# Patient Record
Sex: Female | Born: 1955 | Race: White | Hispanic: No | Marital: Married | Smoking: Never smoker
Health system: Southern US, Community
[De-identification: ages and names within clinical notes are randomized; demographics above are authoritative.]

## PROBLEM LIST (undated history)

## (undated) DIAGNOSIS — Z9889 Other specified postprocedural states: Secondary | ICD-10-CM

## (undated) DIAGNOSIS — I739 Peripheral vascular disease, unspecified: Secondary | ICD-10-CM

## (undated) DIAGNOSIS — M1990 Unspecified osteoarthritis, unspecified site: Secondary | ICD-10-CM

## (undated) DIAGNOSIS — F419 Anxiety disorder, unspecified: Secondary | ICD-10-CM

## (undated) DIAGNOSIS — R51 Headache: Secondary | ICD-10-CM

## (undated) DIAGNOSIS — F32A Depression, unspecified: Secondary | ICD-10-CM

## (undated) DIAGNOSIS — F329 Major depressive disorder, single episode, unspecified: Secondary | ICD-10-CM

## (undated) DIAGNOSIS — R112 Nausea with vomiting, unspecified: Secondary | ICD-10-CM

## (undated) HISTORY — PX: BREAST SURGERY: SHX581

## (undated) HISTORY — PX: OTHER SURGICAL HISTORY: SHX169

---

## 1979-09-29 HISTORY — PX: BREAST EXCISIONAL BIOPSY: SUR124

## 2003-04-09 ENCOUNTER — Encounter: Admission: RE | Admit: 2003-04-09 | Discharge: 2003-04-09 | Payer: Self-pay | Admitting: Internal Medicine

## 2003-04-09 ENCOUNTER — Encounter: Payer: Self-pay | Admitting: Internal Medicine

## 2003-10-24 ENCOUNTER — Encounter: Admission: RE | Admit: 2003-10-24 | Discharge: 2003-10-24 | Payer: Self-pay | Admitting: Family Medicine

## 2004-06-12 ENCOUNTER — Encounter: Admission: RE | Admit: 2004-06-12 | Discharge: 2004-06-12 | Payer: Self-pay | Admitting: Family Medicine

## 2005-02-17 ENCOUNTER — Encounter: Admission: RE | Admit: 2005-02-17 | Discharge: 2005-02-17 | Payer: Self-pay | Admitting: Family Medicine

## 2005-02-19 ENCOUNTER — Other Ambulatory Visit: Admission: RE | Admit: 2005-02-19 | Discharge: 2005-02-19 | Payer: Self-pay | Admitting: Family Medicine

## 2006-03-26 ENCOUNTER — Encounter: Admission: RE | Admit: 2006-03-26 | Discharge: 2006-03-26 | Payer: Self-pay | Admitting: Internal Medicine

## 2007-07-06 ENCOUNTER — Encounter: Admission: RE | Admit: 2007-07-06 | Discharge: 2007-07-06 | Payer: Self-pay | Admitting: Obstetrics and Gynecology

## 2008-07-16 ENCOUNTER — Encounter: Admission: RE | Admit: 2008-07-16 | Discharge: 2008-07-16 | Payer: Self-pay | Admitting: Obstetrics and Gynecology

## 2009-07-29 ENCOUNTER — Other Ambulatory Visit: Admission: RE | Admit: 2009-07-29 | Discharge: 2009-07-29 | Payer: Self-pay | Admitting: Family Medicine

## 2009-08-15 ENCOUNTER — Encounter: Admission: RE | Admit: 2009-08-15 | Discharge: 2009-08-15 | Payer: Self-pay | Admitting: Family Medicine

## 2009-12-09 ENCOUNTER — Encounter: Admission: RE | Admit: 2009-12-09 | Discharge: 2009-12-09 | Payer: Self-pay | Admitting: Family Medicine

## 2009-12-13 ENCOUNTER — Encounter: Admission: RE | Admit: 2009-12-13 | Discharge: 2009-12-13 | Payer: Self-pay | Admitting: Family Medicine

## 2009-12-13 HISTORY — PX: BREAST BIOPSY: SHX20

## 2011-06-23 ENCOUNTER — Other Ambulatory Visit: Payer: Self-pay | Admitting: Family Medicine

## 2011-06-23 ENCOUNTER — Other Ambulatory Visit (HOSPITAL_COMMUNITY)
Admission: RE | Admit: 2011-06-23 | Discharge: 2011-06-23 | Disposition: A | Discharge status: Home / Self Care | Payer: BC Managed Care – PPO | Source: Ambulatory Visit | Attending: Family Medicine | Admitting: Family Medicine

## 2011-06-23 DIAGNOSIS — Z124 Encounter for screening for malignant neoplasm of cervix: Secondary | ICD-10-CM | POA: Insufficient documentation

## 2011-07-08 ENCOUNTER — Other Ambulatory Visit: Payer: Self-pay | Admitting: Family Medicine

## 2011-07-08 DIAGNOSIS — Z1231 Encounter for screening mammogram for malignant neoplasm of breast: Secondary | ICD-10-CM

## 2011-08-14 ENCOUNTER — Ambulatory Visit
Admission: RE | Admit: 2011-08-14 | Discharge: 2011-08-14 | Disposition: A | Discharge status: Home / Self Care | Payer: BC Managed Care – PPO | Source: Ambulatory Visit | Attending: Family Medicine | Admitting: Family Medicine

## 2011-08-14 DIAGNOSIS — Z1231 Encounter for screening mammogram for malignant neoplasm of breast: Secondary | ICD-10-CM

## 2011-09-24 ENCOUNTER — Encounter (HOSPITAL_COMMUNITY): Payer: Self-pay | Admitting: Pharmacy Technician

## 2011-10-04 NOTE — H&P (Signed)
Amber Kane is an 56 y.o. female.    Chief Complaint: bilateral knee OA and pain   HPI: Pt is a 56 y.o. female complaining of bilateral knee pain since 2005. She had torn meniscus in 2005 that was repaired by Dr. Ranell Patrick. The surgery helped some, however the pain returned and has been continually increased since. X-rays in the clinic show end-stage arthritic changes of the both knees. Pt has tried various conservative treatments which have failed to alleviate their symptoms including steroid injections. Various options are discussed with the patient. Risks, benefits and expectations were discussed with the patient. Patient understand the risks, benefits and expectations and wishes to proceed with surgery.   PCP:  No primary provider on file.  D/C Plans:  SNF  Post-op Meds:  No Rx given   Tranexamic Acid:  To be given  PMH: Hypercholesteremia, diet controlled Seasonal Allergies  PSH: Breast biopsy Laparoscopy D&C x2 Right knee arthroscopy  Social History: She denies the use of tobacco. Admits to drinking less than a glass a day of alcohol.  Allergies:  No Known Allergies  Medications: Multivitamin one PO daily Cetirizine 10 mg one PO qod Excedrin Migraine prn (stopped a week prior to surgery) Osteo Biflex one PO daily  ROS Review of Systems  Constitutional: Negative.   HENT: Negative.   Eyes: Negative.   Respiratory: Negative.   Cardiovascular: Negative.   Gastrointestinal: Negative.   Genitourinary: Negative.   Musculoskeletal: Positive for joint pain.  Skin: Negative.   Neurological: Negative.   Endo/Heme/Allergies: Negative.   Psychiatric/Behavioral: Negative.      Physican Exam: Physical Exam  Constitutional: She is oriented to person, place, and time and well-developed, well-nourished, and in no distress.  HENT:  Head: Normocephalic and atraumatic.  Nose: Nose normal.  Mouth/Throat: Oropharynx is clear and moist.  Eyes: Pupils are equal, round, and  reactive to light.  Neck: Neck supple. No JVD present. No tracheal deviation present. No thyromegaly present.  Cardiovascular: Normal rate and regular rhythm.   Pulmonary/Chest: Effort normal and breath sounds normal. No stridor.  Abdominal: Soft. There is no tenderness. There is no guarding.  Musculoskeletal:       Right knee: She exhibits decreased range of motion (10-90), swelling and bony tenderness. She exhibits no effusion, no ecchymosis, no deformity, no laceration and no erythema. tenderness found.       Left knee: She exhibits decreased range of motion (5-90), swelling and bony tenderness. She exhibits no effusion, no ecchymosis, no deformity, no laceration and no erythema. tenderness found.  Lymphadenopathy:    She has no cervical adenopathy.  Neurological: She is alert and oriented to person, place, and time.  Skin: Skin is warm and dry. No rash noted. No erythema. No pallor.  Psychiatric: Affect normal.     Assessment/Plan Assessment: bilateral knee OA and pain   Plan: Patient will undergo a bilateral total knee arthroplasties on 10/12/2011. Risks benefits and expectation were discussed with the patient. Patient understand risks, benefits and expectation and wishes to proceed.   Amber Kane Amber Kane   PAC  10/04/2011, 11:44 PM

## 2011-10-06 ENCOUNTER — Encounter (HOSPITAL_COMMUNITY)
Admission: RE | Admit: 2011-10-06 | Discharge: 2011-10-06 | Disposition: A | Discharge status: Home / Self Care | Payer: BC Managed Care – PPO | Source: Ambulatory Visit | Attending: Orthopedic Surgery | Admitting: Orthopedic Surgery

## 2011-10-06 ENCOUNTER — Encounter (HOSPITAL_COMMUNITY): Payer: Self-pay

## 2011-10-06 HISTORY — DX: Unspecified osteoarthritis, unspecified site: M19.90

## 2011-10-06 HISTORY — DX: Other specified postprocedural states: Z98.890

## 2011-10-06 HISTORY — DX: Depression, unspecified: F32.A

## 2011-10-06 HISTORY — DX: Headache: R51

## 2011-10-06 HISTORY — DX: Peripheral vascular disease, unspecified: I73.9

## 2011-10-06 HISTORY — DX: Nausea with vomiting, unspecified: R11.2

## 2011-10-06 HISTORY — DX: Anxiety disorder, unspecified: F41.9

## 2011-10-06 HISTORY — DX: Major depressive disorder, single episode, unspecified: F32.9

## 2011-10-06 LAB — CBC
MCH: 30.5 pg (ref 26.0–34.0)
MCHC: 33.7 g/dL (ref 30.0–36.0)
Platelets: 244 10*3/uL (ref 150–400)
RDW: 13.2 % (ref 11.5–15.5)

## 2011-10-06 LAB — SURGICAL PCR SCREEN
MRSA, PCR: NEGATIVE
Staphylococcus aureus: NEGATIVE

## 2011-10-06 LAB — DIFFERENTIAL
Basophils Absolute: 0 10*3/uL (ref 0.0–0.1)
Eosinophils Absolute: 0.2 10*3/uL (ref 0.0–0.7)
Eosinophils Relative: 3 % (ref 0–5)
Lymphs Abs: 2.2 10*3/uL (ref 0.7–4.0)
Monocytes Absolute: 0.7 10*3/uL (ref 0.1–1.0)

## 2011-10-06 LAB — URINE MICROSCOPIC-ADD ON

## 2011-10-06 LAB — URINALYSIS, ROUTINE W REFLEX MICROSCOPIC
Bilirubin Urine: NEGATIVE
Leukocytes, UA: NEGATIVE
Nitrite: NEGATIVE
Specific Gravity, Urine: 1.024 (ref 1.005–1.030)
pH: 5.5 (ref 5.0–8.0)

## 2011-10-06 LAB — BASIC METABOLIC PANEL
Calcium: 9.5 mg/dL (ref 8.4–10.5)
GFR calc Af Amer: >90 mL/min (ref >90)
GFR calc non Af Amer: >90 mL/min (ref >90)
Potassium: 3.8 mEq/L (ref 3.5–5.1)
Sodium: 137 mEq/L (ref 135–145)

## 2011-10-06 LAB — APTT: aPTT: 29 seconds (ref 24–37)

## 2011-10-06 LAB — HCG, SERUM, QUALITATIVE: Preg, Serum: NEGATIVE

## 2011-10-06 LAB — PROTIME-INR: INR: 1.03 (ref 0.00–1.49)

## 2011-10-06 MED ORDER — CEFAZOLIN SODIUM 1-5 GM-% IV SOLN: 1.0000 g | INTRAVENOUS | Status: DC

## 2011-10-06 NOTE — Patient Instructions (Signed)
20 Amber Kane  10/06/2011   Your procedure is scheduled on:  10/12/11 1200-300pm  Report to [facility] at 1000 AM.  Call this number if you have problems the morning of surgery: [phone]   Remember:   Do not eat food:After Midnight.  May have clear liquids:until Midnight .  Clear liquids include soda, tea, black coffee, apple or grape juice, broth.  Take these medicines the morning of surgery with A SIP OF WATER:    Do not wear jewelry, make-up or nail polish.  Do not wear lotions, powders, or perfumes.   Do not shave 48 hours prior to surgery.  Do not bring valuables to the hospital.  Contacts, dentures or bridgework may not be worn into surgery.  Leave suitcase in the car. After surgery it may be brought to your room.  For patients admitted to the hospital, checkout time is 11:00 AM the day of discharge.      Special Instructions: CHG Shower Use Special Wash: 1/2 bottle night before surgery and 1/2 bottle morning of surgery.   Please read over the following fact sheets that you were given: MRSA Information, Incentive Spirometry Fact Sheet, coughing and deep breathing exercises, leg exercises , Blood Transfusion Fact Sheet

## 2011-10-12 ENCOUNTER — Encounter (HOSPITAL_COMMUNITY): Payer: Self-pay | Admitting: *Deleted

## 2011-10-12 ENCOUNTER — Inpatient Hospital Stay (HOSPITAL_COMMUNITY): Payer: BC Managed Care – PPO | Admitting: Anesthesiology

## 2011-10-12 ENCOUNTER — Inpatient Hospital Stay (HOSPITAL_COMMUNITY)
Admission: RE | Admit: 2011-10-12 | Discharge: 2011-10-20 | DRG: 471 | Disposition: A | Discharge status: Skilled Nursing Facility | Payer: BC Managed Care – PPO | Source: Ambulatory Visit | Attending: Orthopedic Surgery | Admitting: Orthopedic Surgery

## 2011-10-12 ENCOUNTER — Encounter (HOSPITAL_COMMUNITY): Payer: Self-pay | Admitting: Anesthesiology

## 2011-10-12 ENCOUNTER — Encounter (HOSPITAL_COMMUNITY)
Admission: RE | Disposition: A | Discharge status: Skilled Nursing Facility | Payer: Self-pay | Source: Ambulatory Visit | Attending: Orthopedic Surgery

## 2011-10-12 DIAGNOSIS — T50995A Adverse effect of other drugs, medicaments and biological substances, initial encounter: Secondary | ICD-10-CM | POA: Diagnosis not present

## 2011-10-12 DIAGNOSIS — R51 Headache: Secondary | ICD-10-CM | POA: Diagnosis not present

## 2011-10-12 DIAGNOSIS — E7800 Pure hypercholesterolemia, unspecified: Secondary | ICD-10-CM | POA: Diagnosis present

## 2011-10-12 DIAGNOSIS — Z96659 Presence of unspecified artificial knee joint: Secondary | ICD-10-CM

## 2011-10-12 DIAGNOSIS — Z01812 Encounter for preprocedural laboratory examination: Secondary | ICD-10-CM

## 2011-10-12 DIAGNOSIS — M1710 Unilateral primary osteoarthritis, unspecified knee: Principal | ICD-10-CM | POA: Diagnosis present

## 2011-10-12 DIAGNOSIS — R112 Nausea with vomiting, unspecified: Secondary | ICD-10-CM | POA: Diagnosis not present

## 2011-10-12 HISTORY — PX: TOTAL KNEE ARTHROPLASTY: SHX125

## 2011-10-12 LAB — GLUCOSE, CAPILLARY: Glucose-Capillary: 151 mg/dL — ABNORMAL HIGH (ref 70–99)

## 2011-10-12 LAB — HEMOGLOBIN AND HEMATOCRIT, BLOOD: Hemoglobin: 11.8 g/dL — ABNORMAL LOW (ref 12.0–15.0)

## 2011-10-12 LAB — TYPE AND SCREEN: ABO/RH(D): O POS

## 2011-10-12 LAB — ABO/RH: ABO/RH(D): O POS

## 2011-10-12 SURGERY — ARTHROPLASTY, KNEE, BILATERAL, TOTAL
Anesthesia: General | Site: Knee | Laterality: Bilateral | Wound class: Clean

## 2011-10-12 MED ORDER — DEXAMETHASONE SODIUM PHOSPHATE 10 MG/ML IJ SOLN: INTRAMUSCULAR | Status: DC | PRN

## 2011-10-12 MED ORDER — DEXAMETHASONE SODIUM PHOSPHATE 10 MG/ML IJ SOLN
10.0000 mg | Freq: Once | INTRAMUSCULAR | Status: AC
  Administered 2011-10-13: 10 mg via INTRAVENOUS
  Filled 2011-10-12: qty 1

## 2011-10-12 MED ORDER — PHENOL 1.4 % MT LIQD
1.0000 | OROMUCOSAL | Status: DC | PRN
  Filled 2011-10-12: qty 177

## 2011-10-12 MED ORDER — CHLORHEXIDINE GLUCONATE 4 % EX LIQD: 60.0000 mL | Freq: Once | CUTANEOUS | Status: DC

## 2011-10-12 MED ORDER — FERROUS SULFATE 325 (65 FE) MG PO TABS
325.0000 mg | ORAL_TABLET | Freq: Three times a day (TID) | ORAL | Status: DC
  Administered 2011-10-13 – 2011-10-20 (×19): 325 mg via ORAL
  Filled 2011-10-12 (×24): qty 1

## 2011-10-12 MED ORDER — LIDOCAINE HCL (CARDIAC) 20 MG/ML IV SOLN
INTRAVENOUS | Status: DC | PRN
  Administered 2011-10-12: 50 mg via INTRAVENOUS

## 2011-10-12 MED ORDER — CEFAZOLIN SODIUM-DEXTROSE 2-3 GM-% IV SOLR
2.0000 g | Freq: Once | INTRAVENOUS | Status: AC
  Administered 2011-10-12: 2 g via INTRAVENOUS

## 2011-10-12 MED ORDER — METOCLOPRAMIDE HCL 10 MG PO TABS
5.0000 mg | ORAL_TABLET | Freq: Three times a day (TID) | ORAL | Status: DC | PRN
  Administered 2011-10-14: 10 mg via ORAL
  Filled 2011-10-12: qty 1

## 2011-10-12 MED ORDER — LORATADINE 10 MG PO TABS
10.0000 mg | ORAL_TABLET | Freq: Every day | ORAL | Status: DC
  Administered 2011-10-13 – 2011-10-20 (×7): 10 mg via ORAL
  Filled 2011-10-12 (×8): qty 1

## 2011-10-12 MED ORDER — HYDROMORPHONE HCL PF 1 MG/ML IJ SOLN
0.5000 mg | INTRAMUSCULAR | Status: DC | PRN
  Administered 2011-10-12 – 2011-10-14 (×6): 1 mg via INTRAVENOUS
  Filled 2011-10-12 (×6): qty 1

## 2011-10-12 MED ORDER — ZOLPIDEM TARTRATE 5 MG PO TABS: 5.0000 mg | ORAL_TABLET | Freq: Every evening | ORAL | Status: DC | PRN

## 2011-10-12 MED ORDER — DOCUSATE SODIUM 100 MG PO CAPS
100.0000 mg | ORAL_CAPSULE | Freq: Two times a day (BID) | ORAL | Status: DC
  Administered 2011-10-13 – 2011-10-20 (×11): 100 mg via ORAL
  Filled 2011-10-12 (×17): qty 1

## 2011-10-12 MED ORDER — METHOCARBAMOL 500 MG PO TABS
500.0000 mg | ORAL_TABLET | Freq: Four times a day (QID) | ORAL | Status: DC | PRN
  Administered 2011-10-16 – 2011-10-19 (×4): 500 mg via ORAL
  Filled 2011-10-12 (×4): qty 1

## 2011-10-12 MED ORDER — HYDROCODONE-ACETAMINOPHEN 7.5-325 MG PO TABS
1.0000 | ORAL_TABLET | ORAL | Status: DC
  Administered 2011-10-13 (×4): 1 via ORAL
  Administered 2011-10-14 (×2): 2 via ORAL
  Administered 2011-10-14: 1 via ORAL
  Administered 2011-10-14 – 2011-10-15 (×5): 2 via ORAL
  Filled 2011-10-12 (×3): qty 1
  Filled 2011-10-12: qty 2
  Filled 2011-10-12: qty 1
  Filled 2011-10-12: qty 2
  Filled 2011-10-12: qty 1
  Filled 2011-10-12 (×5): qty 2

## 2011-10-12 MED ORDER — TRANEXAMIC ACID 100 MG/ML IV SOLN
1250.0000 mg | Freq: Once | INTRAVENOUS | Status: AC
  Administered 2011-10-12: 1250 mg via INTRAVENOUS
  Filled 2011-10-12: qty 12.5

## 2011-10-12 MED ORDER — ACETAMINOPHEN 10 MG/ML IV SOLN
INTRAVENOUS | Status: DC | PRN
  Administered 2011-10-12: 1000 mg via INTRAVENOUS

## 2011-10-12 MED ORDER — PROPOFOL 10 MG/ML IV BOLUS
INTRAVENOUS | Status: DC | PRN
  Administered 2011-10-12: 170 mg via INTRAVENOUS

## 2011-10-12 MED ORDER — POLYETHYLENE GLYCOL 3350 17 G PO PACK
17.0000 g | PACK | Freq: Two times a day (BID) | ORAL | Status: DC
  Administered 2011-10-13 – 2011-10-19 (×10): 17 g via ORAL
  Filled 2011-10-12 (×17): qty 1

## 2011-10-12 MED ORDER — ALUM & MAG HYDROXIDE-SIMETH 200-200-20 MG/5ML PO SUSP: 30.0000 mL | ORAL | Status: DC | PRN

## 2011-10-12 MED ORDER — SODIUM CHLORIDE 0.9 % IR SOLN
Status: DC | PRN
  Administered 2011-10-12: 1000 mL

## 2011-10-12 MED ORDER — BUPIVACAINE HCL (PF) 0.25 % IJ SOLN
INTRAMUSCULAR | Status: DC | PRN
  Administered 2011-10-12: 10 mg
  Administered 2011-10-12: 5 mg

## 2011-10-12 MED ORDER — FENTANYL CITRATE 0.05 MG/ML IJ SOLN
50.0000 ug | INTRAMUSCULAR | Status: DC | PRN
  Administered 2011-10-12: 50 ug via INTRAVENOUS

## 2011-10-12 MED ORDER — DEXAMETHASONE SODIUM PHOSPHATE 10 MG/ML IJ SOLN
10.0000 mg | Freq: Once | INTRAMUSCULAR | Status: AC
  Administered 2011-10-12: 10 mg via INTRAVENOUS

## 2011-10-12 MED ORDER — ACETAMINOPHEN 325 MG PO TABS
650.0000 mg | ORAL_TABLET | Freq: Four times a day (QID) | ORAL | Status: DC | PRN
  Administered 2011-10-20: 650 mg via ORAL
  Filled 2011-10-12: qty 2

## 2011-10-12 MED ORDER — SODIUM CHLORIDE 0.9 % IV SOLN
INTRAVENOUS | Status: DC
  Administered 2011-10-12 – 2011-10-20 (×12): via INTRAVENOUS
  Filled 2011-10-12 (×32): qty 1000

## 2011-10-12 MED ORDER — LACTATED RINGERS IV SOLN
INTRAVENOUS | Status: DC
  Administered 2011-10-12: 17:00:00 via INTRAVENOUS

## 2011-10-12 MED ORDER — ACETAMINOPHEN 650 MG RE SUPP: 650.0000 mg | Freq: Four times a day (QID) | RECTAL | Status: DC | PRN

## 2011-10-12 MED ORDER — METHOCARBAMOL 100 MG/ML IJ SOLN
500.0000 mg | Freq: Four times a day (QID) | INTRAVENOUS | Status: DC | PRN
  Administered 2011-10-12 – 2011-10-13 (×2): 500 mg via INTRAVENOUS
  Filled 2011-10-12 (×2): qty 5

## 2011-10-12 MED ORDER — DIPHENHYDRAMINE HCL 25 MG PO CAPS: 25.0000 mg | ORAL_CAPSULE | Freq: Four times a day (QID) | ORAL | Status: DC | PRN

## 2011-10-12 MED ORDER — SUCCINYLCHOLINE CHLORIDE 20 MG/ML IJ SOLN
INTRAMUSCULAR | Status: DC | PRN
  Administered 2011-10-12: 100 mg via INTRAVENOUS

## 2011-10-12 MED ORDER — LACTATED RINGERS IV SOLN
INTRAVENOUS | Status: DC
  Administered 2011-10-12: 1000 mL via INTRAVENOUS
  Administered 2011-10-12 (×2): via INTRAVENOUS

## 2011-10-12 MED ORDER — HYDROMORPHONE HCL PF 1 MG/ML IJ SOLN
INTRAMUSCULAR | Status: DC | PRN
  Administered 2011-10-12: 1 mg via INTRAVENOUS

## 2011-10-12 MED ORDER — SODIUM CHLORIDE 0.9 % IJ SOLN
15.0000 mL/h | INTRAMUSCULAR | Status: DC
  Administered 2011-10-12 (×2): 15 mL/h via EPIDURAL
  Filled 2011-10-12 (×12): qty 7.6

## 2011-10-12 MED ORDER — SCOPOLAMINE 1 MG/3DAYS TD PT72
1.0000 | MEDICATED_PATCH | Freq: Once | TRANSDERMAL | Status: DC
  Administered 2011-10-12: 1.5 mg via TRANSDERMAL
  Filled 2011-10-12: qty 1

## 2011-10-12 MED ORDER — ONDANSETRON HCL 4 MG PO TABS
4.0000 mg | ORAL_TABLET | Freq: Four times a day (QID) | ORAL | Status: DC | PRN
  Administered 2011-10-19 (×2): 4 mg via ORAL
  Filled 2011-10-12 (×4): qty 1

## 2011-10-12 MED ORDER — CEFAZOLIN SODIUM-DEXTROSE 2-3 GM-% IV SOLR
2.0000 g | Freq: Four times a day (QID) | INTRAVENOUS | Status: AC
  Administered 2011-10-12 – 2011-10-13 (×3): 2 g via INTRAVENOUS
  Filled 2011-10-12 (×3): qty 50

## 2011-10-12 MED ORDER — MENTHOL 3 MG MT LOZG
1.0000 | LOZENGE | OROMUCOSAL | Status: DC | PRN
  Filled 2011-10-12: qty 9

## 2011-10-12 MED ORDER — FENTANYL CITRATE 0.05 MG/ML IJ SOLN
INTRAMUSCULAR | Status: DC | PRN
  Administered 2011-10-12: 100 ug via INTRAVENOUS
  Administered 2011-10-12 (×5): 50 ug via INTRAVENOUS

## 2011-10-12 MED ORDER — SODIUM CHLORIDE 0.9 % IJ SOLN
15.0000 mL/h | INTRAMUSCULAR | Status: DC
  Administered 2011-10-12 – 2011-10-13 (×2): 15 mL/h via EPIDURAL
  Filled 2011-10-12 (×8): qty 7.6

## 2011-10-12 MED ORDER — BUPIVACAINE-EPINEPHRINE 0.25% -1:200000 IJ SOLN
INTRAMUSCULAR | Status: DC | PRN
  Administered 2011-10-12: 60 mL

## 2011-10-12 MED ORDER — ONDANSETRON HCL 4 MG/2ML IJ SOLN
4.0000 mg | Freq: Three times a day (TID) | INTRAMUSCULAR | Status: DC | PRN
  Administered 2011-10-12: 4 mg via INTRAVENOUS

## 2011-10-12 MED ORDER — EPHEDRINE SULFATE 50 MG/ML IJ SOLN
INTRAMUSCULAR | Status: DC | PRN
  Administered 2011-10-12: 5 mg via INTRAVENOUS
  Administered 2011-10-12: 10 mg via INTRAVENOUS

## 2011-10-12 MED ORDER — ONDANSETRON HCL 4 MG/2ML IJ SOLN
INTRAMUSCULAR | Status: DC | PRN
  Administered 2011-10-12: 4 mg via INTRAVENOUS

## 2011-10-12 MED ORDER — METOCLOPRAMIDE HCL 5 MG/ML IJ SOLN
5.0000 mg | Freq: Three times a day (TID) | INTRAMUSCULAR | Status: DC | PRN
  Administered 2011-10-12 – 2011-10-16 (×4): 10 mg via INTRAVENOUS
  Filled 2011-10-12 (×3): qty 2

## 2011-10-12 MED ORDER — ONDANSETRON HCL 4 MG/2ML IJ SOLN
4.0000 mg | Freq: Four times a day (QID) | INTRAMUSCULAR | Status: DC | PRN
  Administered 2011-10-14 – 2011-10-18 (×7): 4 mg via INTRAVENOUS
  Filled 2011-10-12 (×7): qty 2

## 2011-10-12 MED ORDER — HYDROMORPHONE HCL PF 1 MG/ML IJ SOLN: 0.2500 mg | INTRAMUSCULAR | Status: DC | PRN

## 2011-10-12 MED ORDER — BISACODYL 5 MG PO TBEC: 5.0000 mg | DELAYED_RELEASE_TABLET | Freq: Every day | ORAL | Status: DC | PRN

## 2011-10-12 MED ORDER — MIDAZOLAM HCL 2 MG/2ML IJ SOLN
1.0000 mg | INTRAMUSCULAR | Status: DC | PRN
  Administered 2011-10-12: 2 mg via INTRAVENOUS

## 2011-10-12 MED ORDER — FLEET ENEMA 7-19 GM/118ML RE ENEM: 1.0000 | ENEMA | Freq: Once | RECTAL | Status: AC | PRN

## 2011-10-12 MED ORDER — PROMETHAZINE HCL 25 MG/ML IJ SOLN
6.2500 mg | INTRAMUSCULAR | Status: AC | PRN
  Administered 2011-10-12 (×2): 6.25 mg via INTRAVENOUS

## 2011-10-12 MED ORDER — MEPERIDINE HCL 50 MG/ML IJ SOLN: 6.2500 mg | INTRAMUSCULAR | Status: DC | PRN

## 2011-10-12 MED ORDER — KETOROLAC TROMETHAMINE 30 MG/ML IJ SOLN
INTRAMUSCULAR | Status: DC | PRN
  Administered 2011-10-12: 30 mg

## 2011-10-12 SURGICAL SUPPLY — 66 items
ADH SKN CLS APL DERMABOND .7 (GAUZE/BANDAGES/DRESSINGS) ×2
BAG SPEC THK2 15X12 ZIP CLS (MISCELLANEOUS) ×1
BAG ZIPLOCK 12X15 (MISCELLANEOUS) ×2 IMPLANT
BANDAGE ELASTIC 6 VELCRO ST LF (GAUZE/BANDAGES/DRESSINGS) ×4 IMPLANT
BANDAGE ESMARK 6X9 LF (GAUZE/BANDAGES/DRESSINGS) ×1 IMPLANT
BLADE SAW RECIPROCATING 77.5 (BLADE) ×2 IMPLANT
BLADE SAW SGTL 13.0X1.19X90.0M (BLADE) ×2 IMPLANT
BNDG CMPR 9X6 STRL LF SNTH (GAUZE/BANDAGES/DRESSINGS) ×1
BNDG COHESIVE 4X5 TAN STRL (GAUZE/BANDAGES/DRESSINGS) ×2 IMPLANT
BNDG ESMARK 6X9 LF (GAUZE/BANDAGES/DRESSINGS) ×2
BOWL SMART MIX CTS (DISPOSABLE) ×4 IMPLANT
CEMENT HV SMART SET (Cement) ×8 IMPLANT
CLOTH BEACON ORANGE TIMEOUT ST (SAFETY) ×2 IMPLANT
CUFF TOURN SGL QUICK 34 (TOURNIQUET CUFF) ×4
CUFF TRNQT CYL 34X4X40X1 (TOURNIQUET CUFF) ×2 IMPLANT
DERMABOND ADVANCED (GAUZE/BANDAGES/DRESSINGS) ×2
DERMABOND ADVANCED .7 DNX12 (GAUZE/BANDAGES/DRESSINGS) IMPLANT
DRAPE EXTREMITY BILATERAL (DRAPE) ×2 IMPLANT
DRAPE LG THREE QUARTER DISP (DRAPES) ×8 IMPLANT
DRAPE POUCH INSTRU U-SHP 10X18 (DRAPES) ×2 IMPLANT
DRAPE U-SHAPE 47X51 STRL (DRAPES) ×4 IMPLANT
DRSG ADAPTIC 3X8 NADH LF (GAUZE/BANDAGES/DRESSINGS) ×4 IMPLANT
DRSG AQUACEL AG ADV 3.5X10 (GAUZE/BANDAGES/DRESSINGS) ×2 IMPLANT
DRSG PAD ABDOMINAL 8X10 ST (GAUZE/BANDAGES/DRESSINGS) ×8 IMPLANT
DRSG TEGADERM 4X4.75 (GAUZE/BANDAGES/DRESSINGS) ×2 IMPLANT
DURAPREP 26ML APPLICATOR (WOUND CARE) ×2 IMPLANT
ELECT REM PT RETURN 9FT ADLT (ELECTROSURGICAL) ×2
ELECTRODE REM PT RTRN 9FT ADLT (ELECTROSURGICAL) ×1 IMPLANT
EVACUATOR 1/8 PVC DRAIN (DRAIN) ×4 IMPLANT
GAUZE SPONGE 2X2 8PLY STRL LF (GAUZE/BANDAGES/DRESSINGS) IMPLANT
GLOVE BIOGEL PI IND STRL 7.5 (GLOVE) ×1 IMPLANT
GLOVE BIOGEL PI IND STRL 8 (GLOVE) ×1 IMPLANT
GLOVE BIOGEL PI INDICATOR 7.5 (GLOVE) ×1
GLOVE BIOGEL PI INDICATOR 8 (GLOVE) ×1
GLOVE ECLIPSE 8.0 STRL XLNG CF (GLOVE) IMPLANT
GLOVE ORTHO TXT STRL SZ7.5 (GLOVE) ×4 IMPLANT
GLOVE SURG ORTHO 8.0 STRL STRW (GLOVE) ×2 IMPLANT
GOWN STRL NON-REIN LRG LVL3 (GOWN DISPOSABLE) ×2 IMPLANT
HANDPIECE INTERPULSE COAX TIP (DISPOSABLE) ×2
IMMOBILIZER KNEE 20 (SOFTGOODS)
IMMOBILIZER KNEE 20 THIGH 36 (SOFTGOODS) ×2 IMPLANT
IMMOBILIZER KNEE 22 UNIV (SOFTGOODS) ×2 IMPLANT
KIT BASIN OR (CUSTOM PROCEDURE TRAY) ×2 IMPLANT
MANIFOLD NEPTUNE II (INSTRUMENTS) ×2 IMPLANT
NS IRRIG 1000ML POUR BTL (IV SOLUTION) ×4 IMPLANT
PACK TOTAL JOINT (CUSTOM PROCEDURE TRAY) ×2 IMPLANT
PADDING CAST COTTON 6X4 STRL (CAST SUPPLIES) ×4 IMPLANT
POSITIONER SURGICAL ARM (MISCELLANEOUS) ×2 IMPLANT
SET HNDPC FAN SPRY TIP SCT (DISPOSABLE) ×1 IMPLANT
SET PAD KNEE POSITIONER (MISCELLANEOUS) ×4 IMPLANT
SPONGE GAUZE 2X2 STER 10/PKG (GAUZE/BANDAGES/DRESSINGS) ×2
SPONGE GAUZE 4X4 12PLY (GAUZE/BANDAGES/DRESSINGS) ×8 IMPLANT
SPONGE LAP 18X18 X RAY DECT (DISPOSABLE) ×4 IMPLANT
STAPLER VISISTAT 35W (STAPLE) IMPLANT
STOCKINETTE 8 INCH (MISCELLANEOUS) ×2 IMPLANT
STRIP CLOSURE SKIN 1/2X4 (GAUZE/BANDAGES/DRESSINGS) ×8 IMPLANT
SUCTION FRAZIER 12FR DISP (SUCTIONS) ×2 IMPLANT
SUT MNCRL AB 4-0 PS2 18 (SUTURE) ×4 IMPLANT
SUT VIC AB 1 CT1 27 (SUTURE) ×12
SUT VIC AB 1 CT1 27XBRD ANTBC (SUTURE) ×6 IMPLANT
SUT VIC AB 2-0 CT1 27 (SUTURE) ×16
SUT VIC AB 2-0 CT1 TAPERPNT 27 (SUTURE) ×8 IMPLANT
TOWEL OR 17X26 10 PK STRL BLUE (TOWEL DISPOSABLE) ×4 IMPLANT
TRAY FOLEY CATH 14FRSI W/METER (CATHETERS) ×2 IMPLANT
WATER STERILE IRR 1500ML POUR (IV SOLUTION) ×2 IMPLANT
WRAP KNEE MAXI GEL POST OP (GAUZE/BANDAGES/DRESSINGS) ×4 IMPLANT

## 2011-10-12 NOTE — Anesthesia Procedure Notes (Signed)
Epidural Patient location during procedure: holding area Start time: 10/12/2011 11:30 AM End time: 10/12/2011 11:45 AM Reason for block: post-op pain management  Staffing Anesthesiologist: Ronelle Nigh L Performed by: anesthesiologist   Preanesthetic Checklist Completed: patient identified, site marked, surgical consent, pre-op evaluation, timeout performed, IV checked, risks and benefits discussed and monitors and equipment checked  Epidural Patient position: sitting Prep: Betadine Patient monitoring: heart rate, continuous pulse ox and blood pressure  Needle:  Needle type: Hustead  Needle gauge: 18 G Needle length: 9 cm Catheter type: closed end flexible Catheter size: 20 Guage Test dose: negative and 1.5% lidocaine  Assessment Sensory level: T10  Additional Notes Test dose 1.5% Lidocaine with epi 1:200,000      On first pass with epidural needle there was continued resistance to air until needle was 2/3 way in.  I pulled needle out and noticed that there was a plug of tissue in the needle.  I redirected needle more to left in case I was lateral to the epidural space.  I do not know if the needle went lateral to the epidural space or the tissue plug cause the needle to go through the epidural space.  The second attempt, which was successful, was fairly superficial, and the resistance was not all that great, so I am doubtful I would have gotten such a plug before the epidural space.  I believe I was traversing the ligaments angling lateral from the epidural space, increasing the distance the needle traveled without any loss of resistance.  Patient tolerated the insertion well without complications.

## 2011-10-12 NOTE — Anesthesia Postprocedure Evaluation (Signed)
  Anesthesia Post-op Note  Patient: Amber Kane  Procedure(s) Performed:  TOTAL KNEE BILATERAL  Patient Location: PACU  Anesthesia Type: GA combined with regional for post-op pain  Level of Consciousness: awake and alert   Airway and Oxygen Therapy: Patient Spontanous Breathing  Post-op Pain: mild  Post-op Assessment: Post-op Vital signs reviewed, Patient's Cardiovascular Status Stable, Respiratory Function Stable, Patent Airway and No signs of Nausea or vomiting  Post-op Vital Signs: stable  Complications: No apparent anesthesia complications

## 2011-10-12 NOTE — Interval H&P Note (Signed)
History and Physical Interval Note:  10/12/2011 11:26 AM  Amber Kane  has presented today for surgery, with the diagnosis of bilateral knee osteoarthritis  The various methods of treatment have been discussed with the patient and family. After consideration of risks, benefits and other options for treatment, the patient has consented to  Procedure(s): BILATERAL TOTAL KNEE Replacements as a surgical intervention .  The patients' history has been reviewed, patient examined, no change in status, stable for surgery.  I have reviewed the patients' chart and labs.  Questions were answered to the patient's satisfaction.     Shelda Pal

## 2011-10-12 NOTE — OR Nursing (Signed)
Pt arrived into PACU w/ pale skin, very warm and clammy, w/ c/o nausea, vs obtain w/ cont POX reading, oral temp recorded, CBG also ck'd. MDA informed of above initial PACU assessment, no further orders rec'd at this time.

## 2011-10-12 NOTE — Op Note (Signed)
NAME:  Amber Kane                      MEDICAL RECORD NO.:  [id number]                             FACILITY:  [facility]      PHYSICIAN:  Madlyn Frankel. Charlann Boxer, M.D.  DATE OF BIRTH:  1956-02-27      DATE OF PROCEDURE:  10/12/2011                                     OPERATIVE REPORT         PREOPERATIVE DIAGNOSIS:  Bilateral knee osteoarthritis.      POSTOPERATIVE DIAGNOSIS:  Bilateral knee osteoarthritis.      FINDINGS:  The patient was noted to have complete loss of cartilage and   bone-on-bone arthritis with associated osteophytes in the medial and patellofemoral compartments of   both knees with a significant synovitis and associated effusion.      PROCEDURE:  Bilateral total knee replacement.      COMPONENTS USED:  DePuy rotating platform posterior stabilized knee   system, a size 4N femur, 3 tibia, 12.5 mm insert, and 35 patellar   button for both knees.     SURGEON:  Madlyn Frankel. Charlann Boxer, M.D.      ASSISTANT:  Lanney Gins, PA-C.      ANESTHESIA:  Epidural.      SPECIMENS:  None.      COMPLICATION:  None.      DRAINS:  One Hemovac.  EBL: about 100-150 cc total      TOURNIQUET TIME:   Total Tourniquet Time Documented: Thigh (Left) - 35 minutes .  38 minutes on the right     The patient was stable to the recovery room.      INDICATION FOR PROCEDURE:  Amber Kane is a 56 y.o. female patient of   mine.  The patient had been seen, evaluated, and treated conservatively in the   office with medication, activity modification, and injections.  The patient had   radiographic changes of bone-on-bone arthritis with endplate sclerosis and osteophytes noted in both knees, Predominately on the medial and patellofemoral segments.     The patient failed conservative measures including medication, injections, and activity modification, and at this point was ready for more definitive measures.   Based on the radiographic changes and failed conservative measures, the patient   decided  to proceed with total knee replacement.  Risks of infection,   DVT, component failure, need for revision surgery, postop course, and   expectations were all   discussed and reviewed.  Consent was obtained for benefit of pain   relief.      PROCEDURE IN DETAIL:  The patient was brought to the operative theater.   Once adequate anesthesia, preoperative antibiotics, 2 gm of Ancef administered, the patient was positioned supine with the bilateral thigh tourniquets placed.   Bilateral lower extremities were prepped and draped in sterile fashion.  A time-   out was performed identifying the patient, planned procedure, and the  extremities.      The both lower extremities were placed in [facility] leg holders.    Attention was first directed to the left knee.  The leg was   exsanguinated, tourniquet elevated to 250 mmHg.  A midline incision was   made followed by median parapatellar arthrotomy.  Following initial   exposure, attention was first directed to the patella.  Precut   measurement was noted to be 21 mm.  I resected down to 13 mm and used a   35 patellar button to restore patellar height as well as cover the cut   surface.      The lug holes were drilled and a metal shim was placed to protect the   patella from retractors and saw blades.      At this point, attention was now directed to the femur.  The femoral   canal was opened with a drill, irrigated to try to prevent fat emboli.  An   intramedullary rod was passed at 3 degrees valgus, 11 mm of bone was   resected off the distal femur due to a flexion contracture.  Following this resection, the tibia was   subluxated anteriorly.  Using the extramedullary guide, 10 mm of bone was resected off   the proximal lateral tibia.  We confirmed the gap would be   stable medially and laterally with a 10 mm insert as well as confirmed   the cut was perpendicular in the coronal plane, checking with an alignment rod.      Once this was done, I  sized the femur to be a size 4 in the anterior-   posterior dimension, chose a narrow component based on medial and   lateral dimension.  The size 4 rotation block was then pinned in   position anterior referenced using the C-clamp to set rotation.  The   anterior, posterior, and  chamfer cuts were made without difficulty nor   notching making certain that I was along the anterior cortex to help   with flexion gap stability.      The final box cut was made off the lateral aspect of distal femur.      At this point, the tibia was sized to be a size 3, the size 3 tray was   then pinned in position through the medial third of the tubercle,   drilled, and keel punched.  Trial reduction was now carried with a 4N femur,  3 tibia, a 12.5 mm insert, and the 35 patella botton.  The knee was brought to   extension, full extension with good flexion stability with the patella   tracking through the trochlea without application of pressure.  Given   all these findings, the trial components removed.  Final components were   opened and cement was mixed.  The knee was irrigated with normal saline   solution and pulse lavage.  The synovial lining was   then injected with 0.25% Marcaine with epinephrine and 1 cc of Toradol,   total of 61 cc.      The knee was irrigated.  Final implants were then cemented onto clean and   dried cut surfaces of bone with the knee brought to extension with a 12.5   mm trial insert.      Once the cement had fully cured, the excess cement was removed   throughout the knee.  I confirmed I was satisfied with the range of   motion and stability, and the final 12.5 mm insert was chosen.  It was   placed into the knee.      The tourniquet had been let down at 34 minutes.  No significant   hemostasis required.  The medium Hemovac  drain was placed deep.  The   extensor mechanism was then reapproximated using #1 Vicryl with the knee   in flexion.  The   remaining wound was  closed with 2-0 Vicryl and running 4-0 Monocryl.   The knee was cleaned, dried, dressed sterilely using Dermabond and   Aquacel dressing.  Drain site dressed separately.    Attention was now directed to the right knee.    The leg was exsanguinated, tourniquet elevated to 250 mmHg.  A midline incision was   made followed by median parapatellar arthrotomy.  Following initial   exposure, attention was first directed to the patella.  Precut   measurement was noted to be 21 mm.  I resected down to 13 mm and used a   35 patellar button to restore patellar height as well as cover the cut   surface.      The lug holes were drilled and a metal shim was placed to protect the   patella from retractors and saw blades.      At this point, attention was now directed to the femur.  The femoral   canal was opened with a drill, irrigated to try to prevent fat emboli.  An   intramedullary rod was passed at 3 degrees valgus, 11 mm of bone was   resected off the distal femur due to a flexion contracture.  Following this resection, the tibia was   subluxated anteriorly.  Using the extramedullary guide, 10 mm of bone was resected off   the proximal lateral tibia.  We confirmed the gap would be   stable medially and laterally with a 10 mm insert as well as confirmed   the cut was perpendicular in the coronal plane, checking with an alignment rod.      Once this was done, I sized the femur to be a size 4 in the anterior-   posterior dimension, chose a narrow component based on medial and   lateral dimension.  The size 4 rotation block was then pinned in   position anterior referenced using the C-clamp to set rotation.  The   anterior, posterior, and  chamfer cuts were made without difficulty nor   notching making certain that I was along the anterior cortex to help   with flexion gap stability.      The final box cut was made off the lateral aspect of distal femur.      At this point, the tibia was sized  to be a size 3, the size 3 tray was   then pinned in position through the medial third of the tubercle,   drilled, and keel punched.  Trial reduction was now carried with a 4N femur,  3 tibia, a 12.5 mm insert, and the 35 patella botton.  The knee was brought to   extension, full extension with good flexion stability with the patella   tracking through the trochlea without application of pressure.  Given   all these findings, the trial components removed.  Final components were   opened and cement was mixed.  The knee was irrigated with normal saline   solution and pulse lavage.  The synovial lining was   then injected with 0.25% Marcaine with epinephrine and 1 cc of Toradol,   total of 61 cc.      The knee was irrigated.  Final implants were then cemented onto clean and   dried cut surfaces of bone with the knee brought to extension with a  12.5   mm trial insert.      Once the cement had fully cured, the excess cement was removed   throughout the knee.  I confirmed I was satisfied with the range of   motion and stability, and the final 12.5 mm insert was chosen.  It was   placed into the knee.      The tourniquet had been let down at 34 minutes.  No significant   hemostasis required.  The medium Hemovac drain was placed deep.  The   extensor mechanism was then reapproximated using #1 Vicryl with the knee   in flexion.  The   remaining wound was closed with 2-0 Vicryl and running 4-0 Monocryl.   The knee was cleaned, dried, dressed sterilely using Dermabond and   Aquacel dressing.  Drain site dressed separately. The patient was then brought to recovery room in stable condition, tolerating the procedure   well.   Please note that Physician Assistant, Lanney Gins, was present for the entirety of the case, and was utilized for pre-operative positioning, peri-operative retractor management, general facilitation of the procedure.  He was also utilized for primary wound closure at the end  of the case.              Madlyn Frankel Charlann Boxer, M.D.

## 2011-10-12 NOTE — Transfer of Care (Signed)
Immediate Anesthesia Transfer of Care Note  Patient: Amber Kane  Procedure(s) Performed:  TOTAL KNEE BILATERAL  Patient Location: PACU  Anesthesia Type: General and Epidural  Level of Consciousness: sedated, patient cooperative and responds to stimulaton  Airway & Oxygen Therapy: Patient Spontanous Breathing and Patient connected to face mask oxgen  Post-op Assessment: Report given to PACU RN and Post -op Vital signs reviewed and stable, MAE X4  Post vital signs: Reviewed and stable  Complications: No apparent anesthesia complications

## 2011-10-12 NOTE — Anesthesia Preprocedure Evaluation (Addendum)
Anesthesia Evaluation  Patient identified by MRN, date of birth, ID band Patient awake    Reviewed: Allergy & Precautions, H&P , NPO status , Patient's Chart, lab work & pertinent test results  History of Anesthesia Complications (+) PONV  Airway Mallampati: II TM Distance: >3 FB Neck ROM: full    Dental No notable dental hx.    Pulmonary neg pulmonary ROS,  clear to auscultation  Pulmonary exam normal       Cardiovascular Exercise Tolerance: Good neg cardio ROS regular Normal    Neuro/Psych  Headaches, Anxiety Depression Negative Neurological ROS  Negative Psych ROS   GI/Hepatic negative GI ROS, Neg liver ROS,   Endo/Other  Negative Endocrine ROS  Renal/GU negative Renal ROS  Genitourinary negative   Musculoskeletal   Abdominal   Peds  Hematology negative hematology ROS (+)   Anesthesia Other Findings   Reproductive/Obstetrics negative OB ROS                          Anesthesia Physical Anesthesia Plan  ASA: II  Anesthesia Plan: General   Post-op Pain Management:    Induction: Intravenous  Airway Management Planned: Oral ETT  Additional Equipment:   Intra-op Plan:   Post-operative Plan: Extubation in OR  Informed Consent: I have reviewed the patients History and Physical, chart, labs and discussed the procedure including the risks, benefits and alternatives for the proposed anesthesia with the patient or authorized representative who has indicated his/her understanding and acceptance.   Dental Advisory Given  Plan Discussed with: CRNA and Surgeon  Anesthesia Plan Comments:        Anesthesia Quick Evaluation

## 2011-10-13 ENCOUNTER — Encounter (HOSPITAL_COMMUNITY): Payer: Self-pay | Admitting: Anesthesiology

## 2011-10-13 LAB — BASIC METABOLIC PANEL
CO2: 22 mEq/L (ref 19–32)
Calcium: 8 mg/dL — ABNORMAL LOW (ref 8.4–10.5)
Creatinine, Ser: 0.7 mg/dL (ref 0.50–1.10)
GFR calc non Af Amer: >90 mL/min (ref >90)
Sodium: 136 mEq/L (ref 135–145)

## 2011-10-13 LAB — CBC
MCH: 30.3 pg (ref 26.0–34.0)
MCHC: 33.1 g/dL (ref 30.0–36.0)
MCV: 91.4 fL (ref 78.0–100.0)
Platelets: 208 10*3/uL (ref 150–400)

## 2011-10-13 MED ORDER — SODIUM CHLORIDE 0.9 % IV SOLN
INTRAVENOUS | Status: DC
  Administered 2011-10-13 – 2011-10-14 (×4): via EPIDURAL
  Filled 2011-10-13 (×11): qty 20

## 2011-10-13 MED ORDER — RIVAROXABAN 10 MG PO TABS
10.0000 mg | ORAL_TABLET | Freq: Every day | ORAL | Status: DC
  Filled 2011-10-13: qty 1

## 2011-10-13 NOTE — Progress Notes (Signed)
FL2 and 30 Day note in chart for MD signature. Pt plans to have rehab at [facility]. SNF has confirmed plan and is working with [employer] for insurance prior approval.  Will continue to assist with D/C planning to SNF.

## 2011-10-13 NOTE — Progress Notes (Signed)
Physical Therapy Treatment Patient Details Name: Amber Kane MRN: [id number] DOB: 08-01-1956 Today's Date: 10/13/2011 1450 - 1515; 2GT PT Assessment/Plan  PT - Assessment/Plan PT Plan: Discharge plan remains appropriate PT Frequency: 7X/week Recommendations for Other Services: OT consult Follow Up Recommendations: Skilled nursing facility Equipment Recommended: Defer to next venue PT Goals  Acute Rehab PT Goals Time For Goal Achievement: 7 days Pt will go Supine/Side to Sit: with supervision PT Goal: Supine/Side to Sit - Progress: Progressing toward goal Pt will go Sit to Supine/Side: with supervision PT Goal: Sit to Supine/Side - Progress: Progressing toward goal Pt will go Sit to Stand: with supervision PT Goal: Sit to Stand - Progress: Progressing toward goal Pt will go Stand to Sit: with supervision PT Goal: Stand to Sit - Progress: Progressing toward goal Pt will Ambulate: 51 - 150 feet;with min assist;with rolling walker PT Goal: Ambulate - Progress: Progressing toward goal  PT Treatment Precautions/Restrictions  Precautions Precautions: Knee Required Braces or Orthoses: Yes Knee Immobilizer: Discontinue once straight leg raise with < 10 degree lag Restrictions Weight Bearing Restrictions: No Other Position/Activity Restrictions: WBAT Mobility (including Balance) Bed Mobility Sit to Supine: 1: +2 Total assist Sit to Supine - Details (indicate cue type and reason): cues for sequence and use of UEs for assist - pt 60% Transfers Sit to Stand: 1: +2 Total assist;With upper extremity assist;With armrests;From chair/3-in-1 Sit to Stand Details (indicate cue type and reason): cues for use of UEs and to walk legs under - pt 50% Stand to Sit: 1: +2 Total assist;With upper extremity assist;To bed Stand to Sit Details: cues for use of UEs and to walk legs under - pt 50% Ambulation/Gait Ambulation/Gait Assistance: 1: +2 Total assist Ambulation/Gait Assistance Details (indicate  cue type and reason): cues for posture, sequence, and position from RW - pt 60% Ambulation Distance (Feet): 22 Feet Assistive device: Rolling walker Gait Pattern: Step-to pattern    Exercise    End of Session PT - End of Session Equipment Utilized During Treatment: Gait belt;Right knee immobilizer;Left knee immobilizer Activity Tolerance: Patient tolerated treatment well Patient left: in bed;with call bell in reach;with family/visitor present Nurse Communication: Mobility status for transfers;Mobility status for ambulation General Behavior During Session: [facility] for tasks performed Cognition: [facility] for tasks performed  Murle Hellstrom 10/13/2011, 3:31 PM

## 2011-10-13 NOTE — Progress Notes (Signed)
Subjective: 1 Day Post-Op Procedure(s) (LRB): TOTAL KNEE BILATERAL (Bilateral)   Patient reports pain as mild. No events.   Objective:   VITALS:   Filed Vitals:   10/13/11 0505  BP: 97/59  Pulse: 62  Temp: 99 F (37.2 C)  Resp: 14    Dorsiflexion/Plantar flexion intact Incision: dressing C/D/I No cellulitis present Compartment soft  LABS  Basename 10/13/11 0445 10/12/11 1650  HGB 10.9* 11.8*  HCT 32.9* 35.3*  WBC 12.4* --  PLT 208 --     Basename 10/13/11 0445  NA 136  K 4.5  BUN 10  CREATININE 0.70  GLUCOSE 108*     Assessment/Plan: 1 Day Post-Op Procedure(s) (LRB): TOTAL KNEE BILATERAL (Bilateral)   Advance diet Hemovac drain D/C'ed Pt planning on going to SNF upon discharge   Amber Kane. Amber Kane   PAC  10/13/2011, 7:59 AM

## 2011-10-13 NOTE — Progress Notes (Signed)
S: Comfortable. Has been up walking twice today. Left leg more numb than right leg, but both acceptable. Nausea has resolved.  O: Back: Non-red. Non-tender. Epidural dressing intact.        VSS. Epidural rate at 15 cc/hr.  A/P: Doing well POD #1. Plan to discontinue epidural tomorrow AM.

## 2011-10-13 NOTE — Progress Notes (Signed)
Physical Therapy Evaluation Patient Details Name: Amber Kane MRN: [id number] DOB: 1956/09/06 Today's Date: 10/13/2011 1041 - 1125; EVAL, TE Problem List:  Patient Active Problem List  Diagnoses  . S/P bilateral knee replacement    Past Medical History:  Past Medical History  Diagnosis Date  . PONV (postoperative nausea and vomiting)   . Headache   . Arthritis     bilaterla knees   . Depression     hx of postpartum depression  . Anxiety     hx of anxiety attacks   . Peripheral vascular disease     bilateral leg impaired circulation per pt - not followed by MD    Past Surgical History:  Past Surgical History  Procedure Date  . Breast surgery     right breast biopsy - benign   . Other surgical history     D and C   . Other surgical history     laparoscopic uterine surgery   . Other surgical history     needle breast biopsy on right- benign   . Other surgical history     arthroscopic right knee surgery     PT Assessment/Plan/Recommendation PT Assessment Clinical Impression Statement: Pt with Bil TKR presents with decreased Bil LE strength/ROM and decreased functional mobility.  Pt will benefit from skilled PT intervention to maximize IND for d/c to next venue of care and eventual return home. PT Recommendation/Assessment: Patient will need skilled PT in the acute care venue PT Problem List: Decreased strength;Decreased range of motion;Decreased activity tolerance;Decreased mobility;Decreased balance;Decreased knowledge of use of DME;Pain PT Therapy Diagnosis : Difficulty walking PT Plan PT Frequency: 7X/week PT Treatment/Interventions: DME instruction;Gait training;Stair training;Functional mobility training;Therapeutic activities;Therapeutic exercise;Patient/family education PT Recommendation Recommendations for Other Services: OT consult Follow Up Recommendations: Skilled nursing facility Equipment Recommended: Defer to next venue PT Goals  Acute Rehab PT  Goals PT Goal Formulation: With patient Time For Goal Achievement: 7 days Pt will go Supine/Side to Sit: with supervision PT Goal: Supine/Side to Sit - Progress: Progressing toward goal Pt will go Sit to Supine/Side: with supervision PT Goal: Sit to Supine/Side - Progress: Not met Pt will go Sit to Stand: with supervision PT Goal: Sit to Stand - Progress: Progressing toward goal Pt will go Stand to Sit: with supervision PT Goal: Stand to Sit - Progress: Progressing toward goal Pt will Ambulate: 51 - 150 feet;with min assist;with rolling walker PT Goal: Ambulate - Progress: Progressing toward goal  PT Evaluation Precautions/Restrictions  Precautions Precautions: Knee Required Braces or Orthoses: Yes Knee Immobilizer: Discontinue once straight leg raise with < 10 degree lag Restrictions Weight Bearing Restrictions: No Other Position/Activity Restrictions: WBAT Prior Functioning  Home Living Lives With: Spouse Receives Help From: Family Type of Home: House Home Layout: Two level Prior Function Level of Independence: Independent with basic ADLs;Independent with gait;Independent with transfers Able to Take Stairs?: Yes Cognition Cognition Arousal/Alertness: Awake/alert Overall Cognitive Status: Appears within functional limits for tasks assessed Orientation Level: Oriented X4 Sensation/Coordination Coordination Gross Motor Movements are Fluid and Coordinated: Yes Extremity Assessment RUE Assessment RUE Assessment: Within Functional Limits LUE Assessment LUE Assessment: Within Functional Limits RLE Assessment RLE Assessment: Exceptions to [facility] (-10 - 85 degrees with 3/5 quads) LLE Assessment LLE Assessment: Exceptions to [facility] (-10 - 85 degrees AAROM; quads 2-/5) Mobility (including Balance) Bed Mobility Bed Mobility: Yes Supine to Sit: 1: +2 Total assist Supine to Sit Details (indicate cue type and reason): cues for use of UEs and assist for bil  LEs - pt  50% Transfers Transfers: Yes Sit to Stand: 1: +2 Total assist;From bed;With upper extremity assist Sit to Stand Details (indicate cue type and reason): cues for use of UEs and to walk legs under - pt 50% Stand to Sit: 1: +2 Total assist;To chair/3-in-1;With upper extremity assist;With armrests Stand to Sit Details: cues for use of UEs and to walk legs out - pt 50% Ambulation/Gait Ambulation/Gait: Yes Ambulation/Gait Assistance: 1: +2 Total assist Ambulation/Gait Assistance Details (indicate cue type and reason): cues for posture, sequence, and position from RW - pt 60% Ambulation Distance (Feet): 8 Feet Assistive device: Rolling walker Gait Pattern: Step-to pattern    Exercise  Total Joint Exercises Ankle Circles/Pumps: AROM;10 reps;Both;Supine Quad Sets: AROM;Both;10 reps;Supine Heel Slides: AAROM;Both;10 reps;Supine Straight Leg Raises: AAROM;Both;10 reps;Supine End of Session PT - End of Session Equipment Utilized During Treatment: Gait belt;Right knee immobilizer;Left knee immobilizer Activity Tolerance: Patient tolerated treatment well Patient left: in chair Nurse Communication: Mobility status for transfers;Mobility status for ambulation General Behavior During Session: [facility] for tasks performed Cognition: [facility] for tasks performed  Reyana Leisey 10/13/2011, 1:30 PM

## 2011-10-14 ENCOUNTER — Encounter (HOSPITAL_COMMUNITY): Payer: Self-pay | Admitting: Orthopedic Surgery

## 2011-10-14 LAB — BASIC METABOLIC PANEL
CO2: 28 mEq/L (ref 19–32)
Calcium: 8.4 mg/dL (ref 8.4–10.5)
Chloride: 107 mEq/L (ref 96–112)
Creatinine, Ser: 0.68 mg/dL (ref 0.50–1.10)
GFR calc Af Amer: >90 mL/min (ref >90)
Sodium: 140 mEq/L (ref 135–145)

## 2011-10-14 LAB — CBC
MCH: 30.5 pg (ref 26.0–34.0)
MCV: 92.4 fL (ref 78.0–100.0)
Platelets: 168 10*3/uL (ref 150–400)
RBC: 3.15 MIL/uL — ABNORMAL LOW (ref 3.87–5.11)
RDW: 13.3 % (ref 11.5–15.5)
WBC: 12.2 10*3/uL — ABNORMAL HIGH (ref 4.0–10.5)

## 2011-10-14 MED ORDER — RIVAROXABAN 10 MG PO TABS
10.0000 mg | ORAL_TABLET | Freq: Every day | ORAL | Status: DC
  Filled 2011-10-14: qty 1

## 2011-10-14 MED ORDER — RIVAROXABAN 10 MG PO TABS
10.0000 mg | ORAL_TABLET | Freq: Every day | ORAL | Status: DC
  Administered 2011-10-14 – 2011-10-19 (×6): 10 mg via ORAL
  Filled 2011-10-14 (×7): qty 1

## 2011-10-14 NOTE — Progress Notes (Signed)
Occupational Therapy Evaluation Patient Details Name: Amber Kane MRN: [id number] DOB: [birth year] Today's Date: 10/14/2011 EV2 [id number] Problem List:  Patient Active Problem List  Diagnoses  . S/P bilateral knee replacement    Past Medical History:  Past Medical History  Diagnosis Date  . PONV (postoperative nausea and vomiting)   . Headache   . Arthritis     bilaterla knees   . Depression     hx of postpartum depression  . Anxiety     hx of anxiety attacks   . Peripheral vascular disease     bilateral leg impaired circulation per pt - not followed by MD    Past Surgical History:  Past Surgical History  Procedure Date  . Breast surgery     right breast biopsy - benign   . Other surgical history     D and C   . Other surgical history     laparoscopic uterine surgery   . Other surgical history     needle breast biopsy on right- benign   . Other surgical history     arthroscopic right knee surgery   . Total knee arthroplasty 10/12/2011    Procedure: TOTAL KNEE BILATERAL;  Surgeon: Shelda Pal;  Location: [facility];  Service: Orthopedics;  Laterality: Bilateral;    OT Assessment/Plan/Recommendation OT Assessment Clinical Impression Statement: Pt w/B TKR POD #2. Skilled OT recommended to maximize I w/BADLs to min A in prep for d/c to next venue of care. OT Recommendation/Assessment: Patient will need skilled OT in the acute care venue OT Problem List: Decreased activity tolerance;Decreased knowledge of use of DME or AE Barriers to Discharge: Inaccessible home environment;Decreased caregiver support OT Therapy Diagnosis : Generalized weakness OT Plan OT Frequency: Min 1X/week OT Treatment/Interventions: Self-care/ADL training;Therapeutic activities;DME and/or AE instruction;Patient/family education OT Recommendation Follow Up Recommendations: Skilled nursing facility Equipment Recommended: Defer to next venue Individuals Consulted Consulted and Agree with Results and  Recommendations: Family member/caregiver;Patient Family Member Consulted: husband OT Goals Acute Rehab OT Goals OT Goal Formulation: With patient/family ADL Goals Pt Will Perform Grooming: with supervision;Standing at sink (X 3 tasks to improve standing activity tolerance.) ADL Goal: Grooming - Progress: Goal set today Pt Will Transfer to Toilet: with min assist;3-in-1;Ambulation ADL Goal: Toilet Transfer - Progress: Goal set today Pt Will Perform Toileting - Clothing Manipulation: with min assist;Standing ADL Goal: Toileting - Clothing Manipulation - Progress: Goal set today Pt Will Perform Toileting - Hygiene: with min assist;Sit to stand from 3-in-1/toilet ADL Goal: Toileting - Hygiene - Progress: Goal set today  OT Evaluation Precautions/Restrictions  Precautions Precautions: Knee Required Braces or Orthoses: Yes Knee Immobilizer: Discontinue once straight leg raise with < 10 degree lag Restrictions Weight Bearing Restrictions: No Other Position/Activity Restrictions: WBAT Prior Functioning Home Living Home Adaptive Equipment: Walker - rolling Prior Function Level of Independence: Independent with basic ADLs;Independent with homemaking with ambulation;Independent with transfers;Independent with gait Driving: Yes Vocation: Full time employment ADL ADL Grooming: Set up;Simulated Where Assessed - Grooming: Sitting, chair;Unsupported Upper Body Bathing: Set up;Simulated Where Assessed - Upper Body Bathing: Sitting, chair;Unsupported Lower Body Bathing: Simulated;Maximal assistance Where Assessed - Lower Body Bathing: Sit to stand from chair Upper Body Dressing: Simulated;Set up Where Assessed - Upper Body Dressing: Unsupported;Sitting, chair Lower Body Dressing: Simulated;Maximal assistance Where Assessed - Lower Body Dressing: Sit to stand from chair Toilet Transfer: Simulated;+2 Total assistance;Comment for patient % (Pt 70% VCs for UE/LE position) Toilet Transfer  Method: Ambulating Toilet Transfer Equipment: Other (comment) (BTB) Toileting -  Clothing Manipulation: Simulated;Maximal assistance Where Assessed - Glass blower/designer Manipulation: Standing Toileting - Hygiene: Simulated;Maximal assistance Where Assessed - Toileting Hygiene: Standing Tub/Shower Transfer: Not assessed Tub/Shower Transfer Method: Not assessed Equipment Used: Rolling walker ADL Comments: Pt limited by nausea. Vision/Perception  Vision - History Baseline Vision: Wears glasses all the time Patient Visual Report: No change from baseline Vision - Assessment Vision Assessment: Vision not tested Cognition Cognition Arousal/Alertness: Awake/alert Overall Cognitive Status: Appears within functional limits for tasks assessed Orientation Level: Oriented X4 Sensation/Coordination   Extremity Assessment RUE Assessment RUE Assessment: Within Functional Limits LUE Assessment LUE Assessment: Within Functional Limits Mobility  Bed Mobility Supine to Sit: 1: +2 Total assist Supine to Sit Details (indicate cue type and reason): pt 65% Sit to Supine: 1: +2 Total assist;HOB flat (Pt 60%) Transfers Sit to Stand: 1: +2 Total assist;From chair/3-in-1;Other (comment) (Pt 70% VCs for UE/LE position) Sit to Stand Details (indicate cue type and reason): cues for use of UEs (pt 70% from elevated bed) Stand to Sit: 1: +2 Total assist;To bed;Other (comment) (Pt 70% VCs for UE/LE position) Stand to Sit Details: cues for use of UEs and for LE management - pt 70% Exercises   End of Session OT - End of Session Equipment Utilized During Treatment: Other (comment) (RW) Activity Tolerance: Other (comment) (limited by nausea.) Patient left: in bed;with call bell in reach General Behavior During Session: [facility] for tasks performed Cognition: [facility] for tasks performed   Alec Jaros A, OTR/L [phone] 10/14/2011, 3:23 PM

## 2011-10-14 NOTE — Progress Notes (Signed)
Epidurall removed. Tip intact. No complaints or complications. Will sign off  Phillips Grout MD

## 2011-10-14 NOTE — Progress Notes (Signed)
Physical Therapy Treatment Patient Details Name: Amber Kane MRN: [id number] DOB: 1956-03-10 Today's Date: 10/14/2011 1221 - 1245; 2gt PT Assessment/Plan  PT - Assessment/Plan PT Plan: Discharge plan remains appropriate PT Frequency: 7X/week Recommendations for Other Services: OT consult Follow Up Recommendations: Skilled nursing facility Equipment Recommended: Defer to next venue PT Goals  Acute Rehab PT Goals Time For Goal Achievement: 7 days Pt will go Supine/Side to Sit: with supervision PT Goal: Supine/Side to Sit - Progress: Progressing toward goal Pt will go Sit to Stand: with supervision PT Goal: Sit to Stand - Progress: Progressing toward goal Pt will go Stand to Sit: with supervision PT Goal: Stand to Sit - Progress: Progressing toward goal Pt will Ambulate: 51 - 150 feet;with min assist;with rolling walker PT Goal: Ambulate - Progress: Progressing toward goal  PT Treatment Precautions/Restrictions  Precautions Precautions: Knee Required Braces or Orthoses: Yes Knee Immobilizer: Discontinue once straight leg raise with < 10 degree lag Restrictions Weight Bearing Restrictions: No Other Position/Activity Restrictions: WBAT Mobility (including Balance) Bed Mobility Supine to Sit: 1: +2 Total assist Supine to Sit Details (indicate cue type and reason): pt 65% Transfers Sit to Stand: 1: +2 Total assist;With upper extremity assist;From bed Sit to Stand Details (indicate cue type and reason): cues for use of UEs (pt 70% from elevated bed) Stand to Sit: 1: +2 Total assist;To chair/3-in-1;With upper extremity assist;With armrests Stand to Sit Details: cues for use of UEs and for LE management - pt 70% Ambulation/Gait Ambulation/Gait Assistance: 1: +2 Total assist Ambulation/Gait Assistance Details (indicate cue type and reason): cues for posture and sequence Ambulation Distance (Feet): 38 Feet Assistive device: Rolling walker Gait Pattern: Step-to pattern    Exercise     End of Session PT - End of Session Equipment Utilized During Treatment: Gait belt;Right knee immobilizer;Left knee immobilizer Activity Tolerance: Patient tolerated treatment well Patient left: in chair;with call bell in reach Nurse Communication: Mobility status for transfers;Mobility status for ambulation General Behavior During Session: [facility] for tasks performed Cognition: [facility] for tasks performed  Amber Kane 10/14/2011, 1:40 PM

## 2011-10-14 NOTE — Progress Notes (Signed)
Physical Therapy Treatment Patient Details Name: Amber Kane MRN: [id number] DOB: 1956-04-18 Today's Date: 10/14/2011 0825 - 0855; 2TE PT Assessment/Plan  PT - Assessment/Plan Comments on Treatment Session: OOB deferred pending removal of epidural PT Plan: Discharge plan remains appropriate PT Frequency: 7X/week Recommendations for Other Services: OT consult Follow Up Recommendations: Skilled nursing facility Equipment Recommended: Defer to next venue PT Goals  Acute Rehab PT Goals Time For Goal Achievement: 7 days Pt will go Supine/Side to Sit: with supervision Pt will go Sit to Supine/Side: with supervision Pt will go Sit to Stand: with supervision Pt will go Stand to Sit: with supervision Pt will Ambulate: 51 - 150 feet;with min assist;with rolling walker  PT Treatment Precautions/Restrictions  Precautions Precautions: Knee Required Braces or Orthoses: Yes Knee Immobilizer: Discontinue once straight leg raise with < 10 degree lag Restrictions Weight Bearing Restrictions: No Other Position/Activity Restrictions: WBAT Mobility (including Balance)      Exercise  Total Joint Exercises Ankle Circles/Pumps: AROM;Both;20 reps;Supine Quad Sets: AROM;20 reps;Supine;Both Heel Slides: AAROM;20 reps;Supine;Both Straight Leg Raises: AAROM;20 reps;Supine;Both End of Session    Husein Guedes 10/14/2011, 9:39 AM

## 2011-10-14 NOTE — Progress Notes (Signed)
Subjective: 2 Days Post-Op Procedure(s) (LRB): TOTAL KNEE BILATERAL (Bilateral)   Patient reports pain as mild. No events. Has been up with therapy.  Objective:   VITALS:   Filed Vitals:   10/14/11 0444  BP: 116/69  Pulse: 64  Temp: 98.3 F (36.8 C)  Resp: 16    Neurovascular intact Dorsiflexion/Plantar flexion intact Incision: dressing C/D/I No cellulitis present Compartment soft  LABS  Basename 10/14/11 0514 10/13/11 0445 10/12/11 1650  HGB 9.6* 10.9* 11.8*  HCT 29.1* 32.9* 35.3*  WBC 12.2* 12.4* --  PLT 168 208 --     Basename 10/14/11 0514 10/13/11 0445  NA 140 136  K 4.3 4.5  BUN 12 10  CREATININE 0.68 0.70  GLUCOSE 116* 108*     Assessment/Plan: 2 Days Post-Op Procedure(s) (LRB): TOTAL KNEE BILATERAL (Bilateral)  D/C epidural D/C foley cath (6 hours after D/C epidural) Start Xarelto (6 hours after D/C epidural) Up with therapy D/C IV fluids   Amber Kane. Carolyn Sylvia   PAC  10/14/2011, 9:09 AM

## 2011-10-14 NOTE — Progress Notes (Signed)
  CARE MANAGEMENT NOTE 10/14/2011  Patient:  Amber Kane, Amber Kane   Account Number:  [id number]  Date Initiated:  10/13/2011  Documentation initiated by:  Colleen Can  Subjective/Objective Assessment:   dx bilateral knee osteoarthritis; bilateral knee replacemnts     Action/Plan:   CM spoke with patient. She is requesting  st snf and wants [facility]. Referred to [facility]  2nd choice home with hh services; spouse would be caregiver   Anticipated DC Date:  10/15/2011   Anticipated DC Plan:  SKILLED NURSING FACILITY  In-house referral  Clinical Social Worker      DC Planning Services  CM consult      [facility] Choice  NA   Choice offered to / List presented to:  NA           Status of service:  Completed, signed off Medicare Important Message given?  NA - LOS <3 / Initial given by admissions

## 2011-10-14 NOTE — Progress Notes (Signed)
Physical Therapy Treatment Patient Details Name: Amber Kane MRN: [id number] DOB: 01/04/1956 Today's Date: 10/14/2011 1453 - 1508; GT PT Assessment/Plan  PT - Assessment/Plan PT Plan: Discharge plan remains appropriate PT Frequency: 7X/week Recommendations for Other Services: OT consult Follow Up Recommendations: Skilled nursing facility Equipment Recommended: Defer to next venue PT Goals  Acute Rehab PT Goals Time For Goal Achievement: 7 days Pt will go Supine/Side to Sit: with supervision PT Goal: Supine/Side to Sit - Progress: Progressing toward goal Pt will go Sit to Supine/Side: with supervision PT Goal: Sit to Supine/Side - Progress: Progressing toward goal Pt will go Sit to Stand: with supervision PT Goal: Sit to Stand - Progress: Progressing toward goal Pt will go Stand to Sit: with supervision PT Goal: Stand to Sit - Progress: Progressing toward goal Pt will Ambulate: 51 - 150 feet;with min assist;with rolling walker PT Goal: Ambulate - Progress: Progressing toward goal  PT Treatment Precautions/Restrictions  Precautions Precautions: Knee Required Braces or Orthoses: Yes Knee Immobilizer: Discontinue once straight leg raise with < 10 degree lag Restrictions Weight Bearing Restrictions: No Other Position/Activity Restrictions: WBAT Mobility (including Balance) Bed Mobility Supine to Sit: 1: +2 Total assist Supine to Sit Details (indicate cue type and reason): pt 65% Sit to Supine: 1: +2 Total assist;HOB flat (Pt 60%) Transfers Sit to Stand: 1: +2 Total assist;From chair/3-in-1;Other (comment) (Pt 70% VCs for UE/LE position) Sit to Stand Details (indicate cue type and reason): cues for use of UEs and to walk LEs back Stand to Sit: 1: +2 Total assist;To bed;Other (comment) (Pt 70% VCs for UE/LE position) Stand to Sit Details: cues for use of UEs and to walk LEs back Ambulation/Gait Ambulation/Gait Assistance: 1: +2 Total assist Ambulation/Gait Assistance Details  (indicate cue type and reason): cues for sequence  and position from RW Ambulation Distance (Feet): 28 Feet Assistive device: Rolling walker Gait Pattern: Step-to pattern    Exercise    End of Session PT - End of Session Equipment Utilized During Treatment: Gait belt;Right knee immobilizer;Left knee immobilizer Activity Tolerance: Patient tolerated treatment well Patient left: with call bell in reach;in bed;with family/visitor present Nurse Communication: Mobility status for transfers;Mobility status for ambulation General Behavior During Session: [facility] for tasks performed Cognition: [facility] for tasks performed  Omari Mcmanaway 10/14/2011, 3:59 PM

## 2011-10-15 MED ORDER — PROMETHAZINE HCL 25 MG/ML IJ SOLN
12.5000 mg | Freq: Four times a day (QID) | INTRAMUSCULAR | Status: DC | PRN
  Administered 2011-10-15 – 2011-10-19 (×3): 12.5 mg via INTRAVENOUS
  Filled 2011-10-15 (×3): qty 1

## 2011-10-15 MED ORDER — TRAMADOL HCL 50 MG PO TABS
50.0000 mg | ORAL_TABLET | ORAL | Status: DC
  Administered 2011-10-15 (×2): 100 mg via ORAL
  Administered 2011-10-15: 50 mg via ORAL
  Administered 2011-10-16: 100 mg via ORAL
  Administered 2011-10-16: 50 mg via ORAL
  Administered 2011-10-16 (×3): 100 mg via ORAL
  Administered 2011-10-17: 50 mg via ORAL
  Administered 2011-10-17 (×3): 100 mg via ORAL
  Administered 2011-10-17: 50 mg via ORAL
  Administered 2011-10-17 – 2011-10-19 (×13): 100 mg via ORAL
  Filled 2011-10-15 (×21): qty 2
  Filled 2011-10-15: qty 1
  Filled 2011-10-15: qty 2
  Filled 2011-10-15: qty 1
  Filled 2011-10-15 (×15): qty 2

## 2011-10-15 MED ORDER — ONDANSETRON HCL 4 MG PO TABS
4.0000 mg | ORAL_TABLET | Freq: Four times a day (QID) | ORAL | Status: DC | PRN
  Administered 2011-10-15 – 2011-10-20 (×4): 4 mg via ORAL
  Filled 2011-10-15 (×3): qty 1

## 2011-10-15 NOTE — Progress Notes (Signed)
Subjective: 3 Days Post-Op Procedure(s) (LRB): TOTAL KNEE BILATERAL (Bilateral)   Patient reports pain as mild. Minimal knee pain, however having nausea and vomiting issues, whish she believe is from pain medication.  Objective:   VITALS:   Filed Vitals:   10/15/11 0500  BP: 159/93  Pulse: 72  Temp: 98.2 F (36.8 C)  Resp: 18    Neurovascular intact Dorsiflexion/Plantar flexion intact Incision: dressing C/D/I No cellulitis present Compartment soft  LABS  Basename 10/14/11 0514 10/13/11 0445 10/12/11 1650  HGB 9.6* 10.9* 11.8*  HCT 29.1* 32.9* 35.3*  WBC 12.2* 12.4* --  PLT 168 208 --     Basename 10/14/11 0514 10/13/11 0445  NA 140 136  K 4.3 4.5  BUN 12 10  CREATININE 0.68 0.70  GLUCOSE 116* 108*     Assessment/Plan: 3 Days Post-Op Procedure(s) (LRB): TOTAL KNEE BILATERAL (Bilateral)   Advance diet Up with therapy Change pain medication Add phenergan Approaching discharge to SNF, possibly Friday    Anastasio Auerbach. Zaahir Pickney   PAC  10/15/2011, 12:55 PM

## 2011-10-15 NOTE — Progress Notes (Signed)
Physical Therapy Treatment Patient Details Name: Amber Kane MRN: [id number] DOB: Nov 19, 1955 Today's Date: 10/15/2011 1531 - 1615; TA, 2TE PT Assessment/Plan  PT - Assessment/Plan Comments on Treatment Session: Pt ltd by ongoing nausea PT Plan: Discharge plan remains appropriate PT Frequency: 7X/week Recommendations for Other Services: OT consult Follow Up Recommendations: Skilled nursing facility Equipment Recommended: Defer to next venue PT Goals  Acute Rehab PT Goals Time For Goal Achievement: 7 days Pt will go Supine/Side to Sit: with supervision PT Goal: Supine/Side to Sit - Progress: Progressing toward goal Pt will go Sit to Stand: with supervision PT Goal: Sit to Stand - Progress: Progressing toward goal Pt will go Stand to Sit: with supervision PT Goal: Stand to Sit - Progress: Progressing toward goal Pt will Ambulate: 51 - 150 feet;with min assist;with rolling walker PT Goal: Ambulate - Progress: Progressing toward goal  PT Treatment Precautions/Restrictions  Precautions Precautions: Knee Required Braces or Orthoses: Yes Knee Immobilizer: Discontinue once straight leg raise with < 10 degree lag Restrictions Weight Bearing Restrictions: No Other Position/Activity Restrictions: WBAT Mobility (including Balance) Bed Mobility Supine to Sit: 3: Mod assist Sit to Supine: 3: Mod assist Sit to Supine - Details (indicate cue type and reason): assist with Bil LE- Transfers Sit to Stand: 3: Mod assist;With upper extremity assist;From chair/3-in-1;With armrests Sit to Stand Details (indicate cue type and reason): cues for use of UEs Stand to Sit: 3: Mod assist;With upper extremity assist;To bed;To chair/3-in-1;With armrests Stand to Sit Details: cues for use of UEs Ambulation/Gait Ambulation/Gait Assistance: 3: Mod assist Ambulation/Gait Assistance Details (indicate cue type and reason): cues for stried length, position from RW , and posture Ambulation Distance (Feet): 5  Feet (x2) Assistive device: Rolling walker Gait Pattern: Step-to pattern    Exercise  Total Joint Exercises Ankle Circles/Pumps: AROM;Both;20 reps;Supine Quad Sets: AROM;20 reps;Supine;Both Heel Slides: AAROM;20 reps;Supine;Both Straight Leg Raises: AAROM;20 reps;Supine;Both End of Session PT - End of Session Equipment Utilized During Treatment: Gait belt;Right knee immobilizer;Left knee immobilizer Activity Tolerance: Patient tolerated treatment well Patient left: in bed;with call bell in reach;with family/visitor present Nurse Communication: Mobility status for transfers;Mobility status for ambulation General Behavior During Session: [facility] for tasks performed Cognition: [facility] for tasks performed  Amber Kane 10/15/2011, 4:40 PM

## 2011-10-15 NOTE — Progress Notes (Signed)
Physical Therapy Treatment Patient Details Name: SHARMAYNE JABLON MRN: [id number] DOB: [birth year] Today's Date: 10/15/2011 1316 - 1352; 2GT PT Assessment/Plan  PT - Assessment/Plan PT Plan: Discharge plan remains appropriate PT Frequency: 7X/week Follow Up Recommendations: Skilled nursing facility Equipment Recommended: Defer to next venue PT Goals  Acute Rehab PT Goals Time For Goal Achievement: 7 days Pt will go Supine/Side to Sit: with supervision PT Goal: Supine/Side to Sit - Progress: Progressing toward goal Pt will go Sit to Stand: with supervision PT Goal: Sit to Stand - Progress: Progressing toward goal Pt will go Stand to Sit: with supervision PT Goal: Stand to Sit - Progress: Progressing toward goal Pt will Ambulate: 51 - 150 feet;with min assist;with rolling walker PT Goal: Ambulate - Progress: Progressing toward goal  PT Treatment Precautions/Restrictions  Precautions Precautions: Knee Required Braces or Orthoses: Yes Knee Immobilizer: Discontinue once straight leg raise with < 10 degree lag Restrictions Weight Bearing Restrictions: No Other Position/Activity Restrictions: WBAT Mobility (including Balance) Bed Mobility Supine to Sit: 3: Mod assist Transfers Sit to Stand: 3: Mod assist;From bed;From elevated surface;With upper extremity assist;With armrests;From chair/3-in-1 Stand to Sit: 3: Mod assist;To chair/3-in-1;With armrests;With upper extremity assist Ambulation/Gait Ambulation/Gait Assistance: 3: Mod assist Ambulation/Gait Assistance Details (indicate cue type and reason): cues for stried length, position from RW , and posture Ambulation Distance (Feet): 52 Feet (x2) Assistive device: Rolling walker Gait Pattern: Step-to pattern    Exercise    End of Session PT - End of Session Equipment Utilized During Treatment: Gait belt;Right knee immobilizer;Left knee immobilizer Activity Tolerance: Patient tolerated treatment well Patient left: in chair;with  call bell in reach General Behavior During Session: [facility] for tasks performed Cognition: [facility] for tasks performed  Tyrick Dunagan 10/15/2011, 4:34 PM

## 2011-10-15 NOTE — Progress Notes (Signed)
Pt plans to d/c to [facility] place for rebab. Amber Kane is still working with [employer] for prior approval.

## 2011-10-16 MED ORDER — ACETAMINOPHEN 325 MG PO TABS: 650.0000 mg | ORAL_TABLET | Freq: Four times a day (QID) | ORAL | Status: AC | PRN

## 2011-10-16 MED ORDER — POLYETHYLENE GLYCOL 3350 17 G PO PACK: 17.0000 g | PACK | Freq: Two times a day (BID) | ORAL | Status: AC

## 2011-10-16 MED ORDER — FERROUS SULFATE 325 (65 FE) MG PO TABS: 325.0000 mg | ORAL_TABLET | Freq: Three times a day (TID) | ORAL | Status: AC

## 2011-10-16 MED ORDER — DSS 100 MG PO CAPS: 100.0000 mg | ORAL_CAPSULE | Freq: Two times a day (BID) | ORAL | Status: AC

## 2011-10-16 MED ORDER — METHOCARBAMOL 500 MG PO TABS: 500.0000 mg | ORAL_TABLET | Freq: Four times a day (QID) | ORAL | Status: AC | PRN

## 2011-10-16 MED ORDER — TRAMADOL HCL 50 MG PO TABS: 50.0000 mg | ORAL_TABLET | Freq: Four times a day (QID) | ORAL | Status: AC | PRN

## 2011-10-16 MED ORDER — ASPIRIN EC 325 MG PO TBEC: 325.0000 mg | DELAYED_RELEASE_TABLET | Freq: Two times a day (BID) | ORAL | Status: DC

## 2011-10-16 MED ORDER — DIPHENHYDRAMINE HCL 25 MG PO CAPS: 25.0000 mg | ORAL_CAPSULE | Freq: Four times a day (QID) | ORAL | Status: AC | PRN

## 2011-10-16 MED ORDER — PROMETHAZINE HCL 12.5 MG PO TABS: 12.5000 mg | ORAL_TABLET | Freq: Four times a day (QID) | ORAL | Status: AC | PRN

## 2011-10-16 NOTE — Progress Notes (Signed)
Patient ID: Amber Kane, female   DOB: 17-Jan-1956, 56 y.o.   MRN: [id number] Subjective: 4 Days Post-Op Procedure(s) (LRB): TOTAL KNEE BILATERAL (Bilateral)    Patient reports pain as mild. Nausea improved but still with some headache, otherwise ready to move forward in her progress Objective:   VITALS:   Filed Vitals:   10/16/11 0437  BP: 122/74  Pulse: 74  Temp: 97.7 F (36.5 C)  Resp: 18    Neurovascular intact Bilateral knees, c/d/i, no significant bruising or swelling  LABS  Basename 10/14/11 0514  HGB 9.6*  HCT 29.1*  WBC 12.2*  PLT 168     Basename 10/14/11 0514  NA 140  K 4.3  BUN 12  CREATININE 0.68  GLUCOSE 116*    No results found for this basename: LABPT:2,INR:2 in the last 72 hours   Assessment/Plan: 4 Days Post-Op Procedure(s) (LRB): TOTAL KNEE BILATERAL (Bilateral)   Up with therapy Discharge to SNF  RTC 2 weeks May shower today, if she would like

## 2011-10-16 NOTE — Progress Notes (Signed)
Physical Therapy Treatment Patient Details Name: Amber Kane MRN: [id number] DOB: 18-Nov-1955 Today's Date: 10/16/2011 1500 - 1529; 2TE PT Assessment/Plan  PT - Assessment/Plan Comments on Treatment Session: Pt ltd by ongoing nausea and headache - Pt with spinal leak per RN but ok for bed exercises PT Plan: Discharge plan remains appropriate PT Frequency: 7X/week Follow Up Recommendations: Skilled nursing facility Equipment Recommended: Defer to next venue PT Goals  Acute Rehab PT Goals Time For Goal Achievement: 7 days  PT Treatment Precautions/Restrictions  Precautions Precautions: Knee Required Braces or Orthoses: Yes Knee Immobilizer: Discontinue once straight leg raise with < 10 degree lag Restrictions Weight Bearing Restrictions: No Other Position/Activity Restrictions: WBAT Mobility (including Balance)      Exercise  Total Joint Exercises Ankle Circles/Pumps: AROM;Both;20 reps;Supine Quad Sets: AROM;20 reps;Supine;Both Short Arc Quad: Both;AAROM;AROM;10 reps;5 reps;Supine Heel Slides: AAROM;Both;20 reps;Supine Straight Leg Raises: AAROM;AROM;20 reps;Supine;Both End of Session PT - End of Session Activity Tolerance: Patient tolerated treatment well Patient left: in bed;with call bell in reach;with family/visitor present Nurse Communication: Mobility status for transfers;Mobility status for ambulation General Behavior During Session: [facility] for tasks performed Cognition: [facility] for tasks performed  Katianne Barre 10/16/2011, 3:45 PM

## 2011-10-16 NOTE — Discharge Summary (Addendum)
Physician Discharge Summary  Patient ID: Amber Kane MRN: [id number] DOB/AGE: 11/04/1955 56 y.o.  Admit date: 10/12/2011 Discharge date: 10/17/2011  Procedures:  Procedure(s) (LRB): TOTAL KNEE BILATERAL (Bilateral)  Attending Physician: Shelda Pal, MD   Admission Diagnoses: Bilateral knee OA and pain    Discharge Diagnoses:  Principal Problem:  *S/P bilateral knee replacement Hypercholesteremia, diet controlled  Seasonal Allergies  HPI: Pt is a 56 y.o. female complaining of bilateral knee pain since 2005. She had torn meniscus in 2005 that was repaired by Dr. Ranell Patrick. The surgery helped some, however the pain returned and has been continually increased since. X-rays in the clinic show end-stage arthritic changes of the both knees. Pt has tried various conservative treatments which have failed to alleviate their symptoms including steroid injections. Various options are discussed with the patient. Risks, benefits and expectations were discussed with the patient. Patient understand the risks, benefits and expectations and wishes to proceed with surgery.   PCP: Carilyn Goodpasture, PA, PA   Discharged Condition: good  Hospital Course:  Patient underwent the above stated procedure on 10/12/2011. Patient tolerated the procedure well and brought to the recovery room in good condition and subsequently to the floor.  POD #1 BP: 97/59 ; Pulse: 62 ; Temp: 99 F (37.2 C) ; Resp: 14  Pt's foley was removed, as well as the hemovac drain removed. IV was changed to a saline lock. Patient reports pain as mild. No events. Dorsiflexion/plantar flexion intact, incision: dressing C/D/I, no cellulitis present and compartment soft.  LABS  Basename  10/13/11 0445    HGB  10.9*    HCT  32.9*      POD #2  BP: 116/69 ; Pulse: 64 ; Temp: 98.3 F (36.8 C) ; Resp: 16  Patient reports pain as mild. No events. Has been up with therapy. Dorsiflexion/plantar flexion intact, incision: dressing C/D/I, no  cellulitis present and compartment soft.  LABS  Basename  10/14/11 0514     HGB  9.6*     HCT  29.1*      POD #3  BP: 159/93 ; Pulse: 72 ; Temp: 98.2 F (36.8 C) ; Resp: 18  Patient reports pain as mild. Minimal knee pain, however having nausea and vomiting issues, whish she believe is from pain medication. Neurovascular intact, dorsiflexion/plantar flexion intact, incision: dressing C/D/I, no cellulitis present and compartment soft.   LABS  No new labs  POD #4 BP: 122/74 ; Pulse: 74 ; Temp: 97.7 F (36.5 C) ; Resp: 18  Patient reports pain as mild. Nausea improved but still with some headache, otherwise ready to move forward in her progress. Do to positional headaches and nausea she wasn't ready to be discharged. Neurovascular intact, bilateral knees, c/d/i, no significant bruising or swelling  LABS  No new labs  POD #5 BP: 143/79 ; Pulse: 79 ; Temp: 98.2 F (36.8 C) ; Resp: 18  Patient reports pain as mild. With regards to knees she has done great but unfortunately still dealing with positional headaches and nausea from issues related to he epidural. Neurovascular intact and incision: dressing C/D/I.  LABS  No new labs  POD #6 AF, VSS Slow with PT as expected. N/V + LE's. Probably to rehab. Monday. Dressings dry.  LABS  No new labs  POD #7 BP: 150/87 ; Pulse: 71 ; Temp: 97.4 F (36.3 C) ; Resp: 17  Patient reports pain as mild. Doing better though still having some headaches.   LABS  No new labs  POD #8 BP: 121/77 ; Pulse: 74 ; Temp: 98.1 F (36.7 C) ; Resp: 16  Patient reports pain as mild. Feels much better. No events. Feels ready to be discharged today. Pt's stay was extended do to complication from possible spinal fluid leakage. Ready to be discharged with some modifications to activity.  LABS  No new labs   Discharge Exam: Extremities: Homans sign is negative, no sign of DVT, no edema, redness or tenderness in the calves or thighs and no ulcers, gangrene or  trophic changes  Disposition: SNF with follow up in 2 weeks at [facility]  Physical Therapy / Activity Note:  Pt having some headaches and nausea do to a spinal leak issue. She should remain on bed rest and do bed exercises, quad sets and flexion exercises, until the positional headaches and nausea resolve. Once the positional headaches and nausea resolve PT can be resumed as normal.   Discharge Orders    Future Orders Please Complete By Expires   Call MD / Call 911      Comments:   If you experience chest pain or shortness of breath, CALL 911 and be transported to the hospital emergency room.  If you develope a fever above 101 F, pus (white drainage) or increased drainage or redness at the wound, or calf pain, call your surgeon's office.   Constipation Prevention      Comments:   Drink plenty of fluids.  Prune juice may be helpful.  You may use a stool softener, such as Colace (over the counter) 100 mg twice a day.  Use MiraLax (over the counter) for constipation as needed.   Weight Bearing as taught in Physical Therapy      Comments:   Use a walker or crutches as instructed.   Driving restrictions      Comments:   No driving for 4 weeks   TED hose      Comments:   Use stockings (TED hose) for 2 weeks on both leg(s).  You may remove them at night for sleeping.   Change dressing      Comments:   Maintain surgical dressings for 8 days, then change the dressing daily with sterile 4 x 4 inch gauze dressing and tape. Keep the areas dry and clean.   Increase activity slowly as tolerated      Scheduling Instructions:   Physical Therapy note : Pt having some headaches and nausea do to a spinal leak issue. She should remain on bed rest and do bed exercises, quad sets and flexion exercises, until the positional headaches and nausea resolve. Once the positional headaches and nausea resolve PT can be resumed as normal.   Discharge instructions      Comments:   Physical Therapy note  : Pt having some headaches and nausea do to a spinal leak issue. She should remain on bed rest and do bed exercises, quad sets and flexion exercises, until the positional headaches and nausea resolve. Once the positional headaches and nausea resolve PT can be resumed as normal.  Maintain surgical dressings for 8 days, then replace with gauze and tape. Keep the areas dry and clean until follow up. Follow up in 2 weeks at [facility]. Call with any questions or concerns.         Current Discharge Medication List    START taking these medications   Details  acetaminophen (TYLENOL) 325 MG tablet Take 2 tablets (650 mg total) by mouth every 6 (six) hours as  needed (or Fever >/= 101).    aspirin EC 325 MG tablet Take 1 tablet (325 mg total) by mouth 2 (two) times daily. X 4 weeks Refills: 0    diphenhydrAMINE (BENADRYL) 25 mg capsule Take 1 capsule (25 mg total) by mouth every 6 (six) hours as needed for itching, allergies or sleep.    docusate sodium 100 MG CAPS Take 100 mg by mouth 2 (two) times daily.    ferrous sulfate 325 (65 FE) MG tablet Take 1 tablet (325 mg total) by mouth 3 (three) times daily after meals.    methocarbamol (ROBAXIN) 500 MG tablet Take 1 tablet (500 mg total) by mouth every 6 (six) hours as needed. Qty: 50 tablet, Refills: 0    polyethylene glycol (MIRALAX / GLYCOLAX) packet Take 17 g by mouth 2 (two) times daily.    promethazine (PHENERGAN) 12.5 MG tablet Take 1 tablet (12.5 mg total) by mouth every 6 (six) hours as needed for nausea. Qty: 30 tablet, Refills: 0    traMADol (ULTRAM) 50 MG tablet Take 1-2 tablets (50-100 mg total) by mouth every 6 (six) hours as needed for pain. Qty: 100 tablet, Refills: 0      CONTINUE these medications which have NOT CHANGED   Details  cetirizine (ZYRTEC) 10 MG tablet Take 10 mg by mouth daily.     Misc Natural Products (OSTEO BI-FLEX ADV JOINT SHIELD PO) Take 1 tablet by mouth daily.     Multiple Vitamin  (MULITIVITAMIN WITH MINERALS) TABS Take 1 tablet by mouth daily.       STOP taking these medications     aspirin-acetaminophen-caffeine (EXCEDRIN MIGRAINE) 250-250-65 MG per tablet Comments:  Reason for Stopping:         Additional: Xarelto 10 mg one PO daily x 10 days  (After finishing then can start Aspirin 325 mg one PO bid)   Signed:  Anastasio Auerbach. Aisley Whan   PAC  10/20/2011, 7:58 AM

## 2011-10-16 NOTE — Progress Notes (Signed)
Patient afebrile, vital signs stable.  Adequate analgesia. Site clean, dry, and intact. Patients questions answered.  Epidural catheter removed tip intact, no complications

## 2011-10-17 NOTE — Progress Notes (Signed)
Physical Therapy Treatment Patient Details Name: Amber Kane MRN: [id number] DOB: 15-Nov-1955 Today's Date: 10/17/2011 1412 - 1500; TA, 2TE PT Assessment/Plan  PT - Assessment/Plan Comments on Treatment Session: Pt progressing with strength/ROM but mobility limited by bedrest order secondary to spinal leak PT Plan: Discharge plan remains appropriate PT Frequency: 7X/week Recommendations for Other Services: OT consult Follow Up Recommendations: Skilled nursing facility Equipment Recommended: Defer to next venue PT Goals  Acute Rehab PT Goals Time For Goal Achievement: 7 days Pt will go Supine/Side to Sit: with supervision PT Goal: Supine/Side to Sit - Progress: Progressing toward goal Pt will go Sit to Supine/Side: with supervision PT Goal: Sit to Supine/Side - Progress: Progressing toward goal Pt will go Sit to Stand: with supervision PT Goal: Sit to Stand - Progress: Progressing toward goal Pt will go Stand to Sit: with supervision PT Goal: Stand to Sit - Progress: Progressing toward goal Pt will Ambulate: 51 - 150 feet;with min assist;with rolling walker PT Goal: Ambulate - Progress: Not progressing  PT Treatment Precautions/Restrictions  Precautions Precautions: Knee Required Braces or Orthoses: Yes Knee Immobilizer: Discontinue once straight leg raise with < 10 degree lag Restrictions Weight Bearing Restrictions: No Other Position/Activity Restrictions: WBAT Mobility (including Balance) Bed Mobility Supine to Sit: 4: Min assist Sit to Supine: 4: Min assist Transfers Sit to Stand: 4: Min assist;From bed;From chair/3-in-1;With armrests;With upper extremity assist Stand to Sit: 4: Min assist;With upper extremity assist;With armrests;To bed;To chair/3-in-1 Ambulation/Gait Ambulation/Gait Assistance: 4: Min assist Ambulation/Gait Assistance Details (indicate cue type and reason): cues for position from RW Ambulation Distance (Feet): 5 Feet (x2) Assistive device: Rolling  walker Gait Pattern: Step-to pattern    Exercise  Total Joint Exercises Ankle Circles/Pumps: AROM;Both;20 reps;Supine Quad Sets: AROM;20 reps;Supine;Both Short Arc Quad: Both;AAROM;AROM;10 reps;5 reps;Supine Heel Slides: AAROM;Both;20 reps;Supine Straight Leg Raises: AAROM;AROM;20 reps;Supine;Both End of Session PT - End of Session Activity Tolerance: Treatment limited secondary to medical complications (Comment) (OOB activity ltd by nausea and headache assoc with spinal le) Patient left: in bed;with call bell in reach;with family/visitor present Nurse Communication: Mobility status for transfers;Mobility status for ambulation General Behavior During Session: [facility] for tasks performed Cognition: [facility] for tasks performed  Rochele Lueck 10/17/2011, 3:37 PM

## 2011-10-17 NOTE — Progress Notes (Signed)
Per Care Coordinator, Pt ready for d/c.  Spoke with Eber Jones at [facility].  Facility unable to take Pt today, as they are concern about Pt's recent spinal leak and that she has to lay flat.  Eber Jones suggested that CSW contact facility on Monday with Pt's medical status, with a possible admission on that day.  Notified Care Coordinator.  CSW to follow.  Providence Crosby, LCSWA Clinical Social Work [phone]

## 2011-10-17 NOTE — Clinical Documentation Improvement (Signed)
Addendum to progress note:  Amber Kane does not feel pt is medically stable since she has continued to have headaches and nausea and remains mainly at bed rest.  We will keep the pt over the weekend until this resolves and then proceed with discharge.

## 2011-10-17 NOTE — Progress Notes (Signed)
Patient ID: Amber Kane, female   DOB: May 26, 1956, 56 y.o.   MRN: [id number] Subjective: 5 Days Post-Op Procedure(s) (LRB): TOTAL KNEE BILATERAL (Bilateral)    Patient reports pain as mild. With regards to knees she has done great but unfortunately still dealing with positional headaches and nausea from issues related to he epidural   Objective:   VITALS:   Filed Vitals:   10/17/11 0430  BP: 143/79  Pulse: 79  Temp: 98.2 F (36.8 C)  Resp: 18    Neurovascular intact Incision: dressing C/D/I  LABS No results found for this basename: HGB:3,HCT:3,WBC:3,PLT:3 in the last 72 hours  No results found for this basename: NA:3,K:3,BUN:3,CREATININE:3,GLUCOSE:3 in the last 72 hours  No results found for this basename: LABPT:2,INR:2 in the last 72 hours   Assessment/Plan: 5 Days Post-Op Procedure(s) (LRB): TOTAL KNEE BILATERAL (Bilateral)   Discharge to SNF  OK to D/C today Remain supine until headaches clear Knows to continue with bed exercises as tolerable RTC 2 weeks

## 2011-10-18 NOTE — Progress Notes (Signed)
Physical Therapy Treatment Patient Details Name: Amber Kane MRN: [id number] DOB: 1956/02/12 Today's Date: 10/18/2011 [id number] 2TE 1TA PT Assessment/Plan  PT - Assessment/Plan Comments on Treatment Session: Pt progressing with strength/ROM but mobility limited by bedrest order with BRP secondary to spinal leak; Pt continues with HA/nausea with any elevation of bed; Pt continues to lie in supine all day except when using 3 in 1 . PT Plan: Discharge plan remains appropriate PT Frequency: 7X/week Follow Up Recommendations: Skilled nursing facility Equipment Recommended: Defer to next venue PT Goals  Acute Rehab PT Goals PT Goal: Supine/Side to Sit - Progress: Progressing toward goal PT Goal: Sit to Supine/Side - Progress: Progressing toward goal PT Goal: Sit to Stand - Progress: Progressing toward goal PT Goal: Stand to Sit - Progress: Progressing toward goal PT Goal: Ambulate - Progress: Not progressing (due to medical complications)  PT Treatment Precautions/Restrictions  Precautions Precautions: Knee Required Braces or Orthoses: Yes Knee Immobilizer: Discontinue once straight leg raise with < 10 degree lag Restrictions Weight Bearing Restrictions: No Other Position/Activity Restrictions: WBAT Mobility (including Balance) Bed Mobility Supine to Sit: HOB flat;With rails (min/guard for safety, LE guidance) Supine to Sit Details (indicate cue type and reason): cues for use of UEs  Sit to Supine: HOB flat;With rail (min/guard) Sit to Supine - Details (indicate cue type and reason): cues for sequence and use of UEs for assist  Transfers Sit to Stand: From bed;From chair/3-in-1 (min/guard) Sit to Stand Details (indicate cue type and reason): cues for use of UEs and to walk legs under  Stand to Sit: 4: Min assist;With upper extremity assist;With armrests;To bed;To chair/3-in-1 Stand to Sit Details: cues for use of UEs and to walk legs out  Stand Pivot Transfers: 4: Min assist  (min/guard) Stand Pivot Transfer Details (indicate cue type and reason): from bed<--->chair with RW Ambulation/Gait Assistive device: Rolling walker    Exercise  Total Joint Exercises Ankle Circles/Pumps: AROM;Both;20 reps;Supine Quad Sets: AROM;10 reps;Both;Supine Towel Squeeze: AROM;Both;10 reps;Supine Short Arc Quad: AROM;AAROM;Both;10 reps;Supine Heel Slides: AROM;AAROM;Both;10 reps;Supine Hip ABduction/ADduction: AROM;AAROM;10 reps;Supine;Both Straight Leg Raises: AROM;AAROM;Both;10 reps;Supine End of Session PT - End of Session Activity Tolerance: Treatment limited secondary to medical complications (Comment) (spinal leak with HA and nausea;BR with BRP) Patient left: in bed;with call bell in reach Nurse Communication: Mobility status for transfers General Behavior During Session: [facility] for tasks performed Cognition: [facility] for tasks performed  [facility] 10/18/2011, 4:27 PM

## 2011-10-18 NOTE — Progress Notes (Signed)
PT Cancellation Note  Treatment cancelled today due to medical issues with patient which prohibited therapy. Pt with c/o nausea. On bedrest until nausea and HA resolve;  Pt requests PT come back in pm.  RN aware. [facility] 10/18/2011, 10:35 AM

## 2011-10-18 NOTE — Progress Notes (Signed)
Slow with PT as expected. N/V + LE's.  Probably to rehab. Monday.  Dressings dry.

## 2011-10-19 MED ORDER — ASPIRIN EC 325 MG PO TBEC: 325.0000 mg | DELAYED_RELEASE_TABLET | Freq: Two times a day (BID) | ORAL | Status: AC

## 2011-10-19 MED ORDER — TRAMADOL HCL 50 MG PO TABS
100.0000 mg | ORAL_TABLET | Freq: Four times a day (QID) | ORAL | Status: DC
  Administered 2011-10-19 – 2011-10-20 (×3): 100 mg via ORAL
  Filled 2011-10-19: qty 2

## 2011-10-19 MED ORDER — RIVAROXABAN 10 MG PO TABS: 10.0000 mg | ORAL_TABLET | Freq: Every day | ORAL | Status: AC

## 2011-10-19 NOTE — Progress Notes (Signed)
Physical Therapy Treatment Patient Details Name: Amber Kane MRN: [id number] DOB: [birth year] Today's Date: 10/19/2011 2te PT Assessment/Plan  PT - Assessment/Plan Comments on Treatment Session: Pt progressing with strength/ROM but mobility limited by bedrest order secondary to spinal leak, per MD notes PT Plan: Discharge plan remains appropriate Follow Up Recommendations: Skilled nursing facility Equipment Recommended: Defer to next venue PT Goals  Acute Rehab PT Goals Pt will Perform Home Exercise Program: with supervision, verbal cues required/provided PT Goal: Perform Home Exercise Program - Progress: Progressing toward goal  PT Treatment Precautions/Restrictions  Precautions bed rest per MD notes due to anesthesia  Complications. Pt left in bed with call bell  Precautions: Knee Required Braces or Orthoses: Yes Knee Immobilizer: Discontinue once straight leg raise with < 10 degree lag Restrictions Weight Bearing Restrictions: No Other Position/Activity Restrictions: WBAT Mobility (including Balance)      Exercise  Total Joint Exercises Ankle Circles/Pumps: AROM;Both;20 reps;Supine Quad Sets: AROM;10 reps;Both;Supine Short Arc Quad: AROM;Right;Left;Supine;10 reps Heel Slides: AROM;AAROM;Both;10 reps;Supine Hip ABduction/ADduction: AROM;AAROM;10 reps;Supine;Both Straight Leg Raises: AROM;AAROM;Both;10 reps;Supine  Pt left in bed with call bell and visitors present. Ice to bil knees.  Pt reports continues HA with OOB to 3in1 with nsg.  Discussed PT session, progression of mobility with care coordinator and MD. End of Session    [facility] 10/19/2011, 12:26 PM

## 2011-10-19 NOTE — Progress Notes (Addendum)
Subjective: 7 Days Post-Op Procedure(s) (LRB): TOTAL KNEE BILATERAL (Bilateral)   Patient reports pain as mild. Doing better though still having some headaches. Feels ready to be discharged today.  Objective:   VITALS:   Filed Vitals:   10/19/11 0610  BP: 150/87  Pulse: 71  Temp: 97.4 F (36.3 C)  Resp: 17    Neurovascular intact Dorsiflexion/Plantar flexion intact Incision: dressing C/D/I No cellulitis present Compartment soft  LABS No new labs   Assessment/Plan: 7 Days Post-Op Procedure(s) (LRB): TOTAL KNEE BILATERAL (Bilateral)   Work with PT today Discharge to SNF today   Anastasio Auerbach. Rosmarie Esquibel   PAC  10/19/2011, 12:10 PM

## 2011-10-20 NOTE — Progress Notes (Signed)
CSW continues to assist with d/c planning to SNF. [facility] has confirmed that [employer] has all requested info. SNF will contact CSW once decision regarding pre approval is available. ( [employer] has been closed Sat, Sun, Mon so pre approval was not possible until today ).

## 2011-10-20 NOTE — Progress Notes (Signed)
Physical Therapy Treatment Patient Details Name: Amber Kane MRN: [id number] DOB: 1956/07/02 Today's Date: 10/20/2011 1017 - 1031; GT PT Assessment/Plan  PT - Assessment/Plan Comments on Treatment Session: Pt con't ltd with mobility secondary to increased pressure/headache with increased time out of supine position PT Plan: Discharge plan remains appropriate PT Frequency: 7X/week Follow Up Recommendations: Skilled nursing facility Equipment Recommended: Defer to next venue PT Goals  Acute Rehab PT Goals Time For Goal Achievement: 7 days Pt will go Supine/Side to Sit: with supervision PT Goal: Supine/Side to Sit - Progress: Progressing toward goal Pt will go Sit to Supine/Side: with supervision PT Goal: Sit to Supine/Side - Progress: Progressing toward goal Pt will go Sit to Stand: with supervision PT Goal: Sit to Stand - Progress: Progressing toward goal Pt will go Stand to Sit: with supervision PT Goal: Stand to Sit - Progress: Progressing toward goal Pt will Ambulate: 51 - 150 feet;with min assist;with rolling walker PT Goal: Ambulate - Progress: Progressing toward goal Pt will Perform Home Exercise Program: with supervision, verbal cues required/provided PT Goal: Perform Home Exercise Program - Progress: Progressing toward goal  PT Treatment Precautions/Restrictions  Precautions Precautions: Knee Required Braces or Orthoses: Yes Knee Immobilizer: Discontinue once straight leg raise with < 10 degree lag Restrictions Weight Bearing Restrictions: No Other Position/Activity Restrictions: WBAT Mobility (including Balance) Bed Mobility Supine to Sit: HOB flat (min guard) Supine to Sit Details (indicate cue type and reason): cues for use of UEs  Sit to Supine: HOB flat (min guard) Sit to Supine - Details (indicate cue type and reason): cues for use of UEs  Transfers Sit to Stand: From bed;With upper extremity assist (min guard assist) Sit to Stand Details (indicate cue type  and reason): cues for LE position Stand to Sit: To bed (min guard assist) Stand to Sit Details: cues for LE position Ambulation/Gait Ambulation/Gait Assistance: 4: Min assist Ambulation/Gait Assistance Details (indicate cue type and reason): cues for posture and position from RW Ambulation Distance (Feet): 25 Feet (25 and 56') Assistive device: Rolling walker Gait Pattern: Step-to pattern Stairs:  (bkwd over shower lip with min assist and cues for sequence)    Exercise    End of Session PT - End of Session Equipment Utilized During Treatment: Gait belt Activity Tolerance: Treatment limited secondary to medical complications (Comment) (pt with c/o mild pressure increase head and shoulders) Patient left: in bed;with call bell in reach;with family/visitor present General Behavior During Session: [facility] for tasks performed Cognition: [facility] for tasks performed  Amber Kane 10/20/2011, 10:43 AM

## 2011-10-20 NOTE — Progress Notes (Signed)
Physical Therapy Treatment Patient Details Name: Amber Kane MRN: [id number] DOB: 1956/06/25 Today's Date: 10/20/2011 4098 - 0902; 2TE PT Assessment/Plan   PT Goals     PT Treatment Precautions/Restrictions  Precautions Precautions: Knee Required Braces or Orthoses: Yes Knee Immobilizer: Discontinue once straight leg raise with < 10 degree lag Restrictions Weight Bearing Restrictions: No Other Position/Activity Restrictions: WBAT Mobility (including Balance)      Exercise    End of Session    Amber Kane 10/20/2011, 10:30 AM

## 2011-10-20 NOTE — Progress Notes (Signed)
Subjective: 8 Days Post-Op Procedure(s) (LRB): TOTAL KNEE BILATERAL (Bilateral)   Patient reports pain as mild. Feels much better. No events. Feels ready to be discharged today.  Objective:   VITALS:   Filed Vitals:   10/20/11 0615  BP: 121/77  Pulse: 74  Temp: 98.1 F (36.7 C)  Resp: 16    Neurovascular intact Dorsiflexion/Plantar flexion intact Incision: dressing C/D/I No cellulitis present Compartment soft  LABS No new labs   Assessment/Plan: 8 Days Post-Op Procedure(s) (LRB): TOTAL KNEE BILATERAL (Bilateral)   Up with therapy Discharge to SNF   Amber Kane. Amber Kane   PAC  10/20/2011, 7:55 AM

## 2011-10-20 NOTE — Progress Notes (Signed)
Pt for d/c to SNF--[facility] today after insurance had been authorized. Pt also had episodes of frequent nausea, vomiting & headache noted after epidural cath had been d/c'd and has now resolved. IV d/c'd prior to d/c. Pt also requested to have a shower given by NT before d/c. Aquacel dressing to bil. Knees remain CDI. Husband at bedside to assist with d/c. Pain tolerable to bil. Knees as claimed. Will d/c to SNF once transport is available(ambulance).

## 2011-10-20 NOTE — Progress Notes (Signed)
OT Cancellation Note  ___Treatment cancelled today due to medical issues with patient which prohibited therapy  ___ Treatment cancelled today due to patient receiving procedure or test   __x_ Treatment cancelled today due to patient's refusal to participate. Plan is for d/c to SNF today.  ___ Treatment cancelled today due to   Signature: Garrel Ridgel, OTR/L  Pager [phone] 10/20/2011

## 2012-07-04 ENCOUNTER — Other Ambulatory Visit (HOSPITAL_COMMUNITY)
Admission: RE | Admit: 2012-07-04 | Discharge: 2012-07-04 | Disposition: A | Discharge status: Home / Self Care | Payer: BC Managed Care – PPO | Source: Ambulatory Visit | Attending: Family Medicine | Admitting: Family Medicine

## 2012-07-04 ENCOUNTER — Other Ambulatory Visit: Payer: Self-pay | Admitting: Family Medicine

## 2012-07-04 DIAGNOSIS — Z124 Encounter for screening for malignant neoplasm of cervix: Secondary | ICD-10-CM | POA: Insufficient documentation

## 2012-07-15 ENCOUNTER — Other Ambulatory Visit: Payer: Self-pay | Admitting: Family Medicine

## 2012-07-15 DIAGNOSIS — Z1231 Encounter for screening mammogram for malignant neoplasm of breast: Secondary | ICD-10-CM

## 2012-08-16 ENCOUNTER — Ambulatory Visit
Admission: RE | Admit: 2012-08-16 | Discharge: 2012-08-16 | Disposition: A | Discharge status: Home / Self Care | Payer: BC Managed Care – PPO | Source: Ambulatory Visit | Attending: Family Medicine | Admitting: Family Medicine

## 2012-08-16 DIAGNOSIS — Z1231 Encounter for screening mammogram for malignant neoplasm of breast: Secondary | ICD-10-CM

## 2013-07-21 ENCOUNTER — Other Ambulatory Visit: Payer: Self-pay

## 2013-07-21 DIAGNOSIS — Z1231 Encounter for screening mammogram for malignant neoplasm of breast: Secondary | ICD-10-CM

## 2013-08-31 ENCOUNTER — Ambulatory Visit
Admission: RE | Admit: 2013-08-31 | Discharge: 2013-08-31 | Disposition: A | Discharge status: Home / Self Care | Payer: BC Managed Care – PPO | Source: Ambulatory Visit

## 2013-08-31 DIAGNOSIS — Z1231 Encounter for screening mammogram for malignant neoplasm of breast: Secondary | ICD-10-CM

## 2014-07-03 ENCOUNTER — Other Ambulatory Visit: Payer: Self-pay

## 2014-07-03 DIAGNOSIS — Z1231 Encounter for screening mammogram for malignant neoplasm of breast: Secondary | ICD-10-CM

## 2014-09-03 ENCOUNTER — Ambulatory Visit
Admission: RE | Admit: 2014-09-03 | Discharge: 2014-09-03 | Disposition: A | Discharge status: Home / Self Care | Payer: BC Managed Care – PPO | Source: Ambulatory Visit

## 2014-09-03 DIAGNOSIS — Z1231 Encounter for screening mammogram for malignant neoplasm of breast: Secondary | ICD-10-CM

## 2014-09-04 ENCOUNTER — Other Ambulatory Visit: Payer: Self-pay | Admitting: Family Medicine

## 2014-09-04 DIAGNOSIS — N6325 Unspecified lump in the left breast, overlapping quadrants: Secondary | ICD-10-CM

## 2014-09-04 DIAGNOSIS — N6320 Unspecified lump in the left breast, unspecified quadrant: Principal | ICD-10-CM

## 2014-10-15 ENCOUNTER — Other Ambulatory Visit: Payer: Self-pay | Admitting: Lactation Services

## 2014-10-15 ENCOUNTER — Ambulatory Visit
Admission: RE | Admit: 2014-10-15 | Discharge: 2014-10-15 | Disposition: A | Discharge status: Home / Self Care | Payer: BC Managed Care – PPO | Source: Ambulatory Visit | Attending: Family Medicine | Admitting: Family Medicine

## 2014-10-15 ENCOUNTER — Other Ambulatory Visit: Payer: Self-pay | Admitting: Family Medicine

## 2014-10-15 DIAGNOSIS — N6320 Unspecified lump in the left breast, unspecified quadrant: Principal | ICD-10-CM

## 2014-10-15 DIAGNOSIS — N6310 Unspecified lump in the right breast, unspecified quadrant: Secondary | ICD-10-CM

## 2014-10-15 DIAGNOSIS — N6325 Unspecified lump in the left breast, overlapping quadrants: Secondary | ICD-10-CM

## 2014-11-05 ENCOUNTER — Ambulatory Visit
Admission: RE | Admit: 2014-11-05 | Discharge: 2014-11-05 | Disposition: A | Discharge status: Home / Self Care | Payer: BC Managed Care – PPO | Source: Ambulatory Visit | Attending: Family Medicine | Admitting: Family Medicine

## 2014-11-05 DIAGNOSIS — N6310 Unspecified lump in the right breast, unspecified quadrant: Secondary | ICD-10-CM

## 2015-09-03 ENCOUNTER — Other Ambulatory Visit (HOSPITAL_COMMUNITY)
Admission: RE | Admit: 2015-09-03 | Discharge: 2015-09-03 | Disposition: A | Discharge status: Home / Self Care | Payer: BC Managed Care – PPO | Source: Ambulatory Visit | Attending: Family Medicine | Admitting: Family Medicine

## 2015-09-03 ENCOUNTER — Other Ambulatory Visit: Payer: Self-pay | Admitting: Family Medicine

## 2015-09-03 DIAGNOSIS — Z1151 Encounter for screening for human papillomavirus (HPV): Secondary | ICD-10-CM | POA: Diagnosis not present

## 2015-09-03 DIAGNOSIS — Z01419 Encounter for gynecological examination (general) (routine) without abnormal findings: Secondary | ICD-10-CM | POA: Insufficient documentation

## 2015-09-04 LAB — CYTOLOGY - PAP

## 2015-10-29 ENCOUNTER — Other Ambulatory Visit: Payer: Self-pay

## 2015-10-29 DIAGNOSIS — Z1231 Encounter for screening mammogram for malignant neoplasm of breast: Secondary | ICD-10-CM

## 2015-11-14 ENCOUNTER — Ambulatory Visit
Admission: RE | Admit: 2015-11-14 | Discharge: 2015-11-14 | Disposition: A | Discharge status: Home / Self Care | Payer: BC Managed Care – PPO | Source: Ambulatory Visit

## 2015-11-14 DIAGNOSIS — Z1231 Encounter for screening mammogram for malignant neoplasm of breast: Secondary | ICD-10-CM

## 2016-11-27 ENCOUNTER — Other Ambulatory Visit: Payer: Self-pay | Admitting: Family Medicine

## 2016-11-27 DIAGNOSIS — Z1231 Encounter for screening mammogram for malignant neoplasm of breast: Secondary | ICD-10-CM

## 2016-12-11 ENCOUNTER — Ambulatory Visit
Admission: RE | Admit: 2016-12-11 | Discharge: 2016-12-11 | Disposition: A | Discharge status: Home / Self Care | Payer: BC Managed Care – PPO | Source: Ambulatory Visit | Attending: Family Medicine | Admitting: Family Medicine

## 2016-12-11 DIAGNOSIS — Z1231 Encounter for screening mammogram for malignant neoplasm of breast: Secondary | ICD-10-CM

## 2017-03-15 ENCOUNTER — Encounter (HOSPITAL_COMMUNITY): Payer: Self-pay

## 2017-03-15 ENCOUNTER — Emergency Department (HOSPITAL_COMMUNITY)
Admission: EM | Admit: 2017-03-15 | Discharge: 2017-03-15 | Disposition: A | Discharge status: Home / Self Care | Payer: BC Managed Care – PPO | Attending: Emergency Medicine | Admitting: Emergency Medicine

## 2017-03-15 ENCOUNTER — Emergency Department (HOSPITAL_COMMUNITY): Payer: BC Managed Care – PPO

## 2017-03-15 DIAGNOSIS — Z7901 Long term (current) use of anticoagulants: Secondary | ICD-10-CM | POA: Insufficient documentation

## 2017-03-15 DIAGNOSIS — N23 Unspecified renal colic: Secondary | ICD-10-CM | POA: Diagnosis not present

## 2017-03-15 DIAGNOSIS — Z96653 Presence of artificial knee joint, bilateral: Secondary | ICD-10-CM | POA: Insufficient documentation

## 2017-03-15 DIAGNOSIS — Z79899 Other long term (current) drug therapy: Secondary | ICD-10-CM | POA: Diagnosis not present

## 2017-03-15 DIAGNOSIS — R1031 Right lower quadrant pain: Secondary | ICD-10-CM | POA: Diagnosis present

## 2017-03-15 LAB — COMPREHENSIVE METABOLIC PANEL
ALK PHOS: 70 U/L (ref 38–126)
ALT: 13 U/L — AB (ref 14–54)
AST: 23 U/L (ref 15–41)
Albumin: 3.7 g/dL (ref 3.5–5.0)
Anion gap: 8 (ref 5–15)
BUN: 14 mg/dL (ref 6–20)
CALCIUM: 8.9 mg/dL (ref 8.9–10.3)
CO2: 24 mmol/L (ref 22–32)
CREATININE: 0.92 mg/dL (ref 0.44–1.00)
Chloride: 108 mmol/L (ref 101–111)
GFR calc non Af Amer: >60 mL/min (ref >60)
Glucose, Bld: 134 mg/dL — ABNORMAL HIGH (ref 65–99)
Potassium: 3.5 mmol/L (ref 3.5–5.1)
Sodium: 140 mmol/L (ref 135–145)
Total Bilirubin: 0.4 mg/dL (ref 0.3–1.2)
Total Protein: 6.2 g/dL — ABNORMAL LOW (ref 6.5–8.1)

## 2017-03-15 LAB — URINALYSIS, ROUTINE W REFLEX MICROSCOPIC
BILIRUBIN URINE: NEGATIVE
Bacteria, UA: NONE SEEN
Glucose, UA: NEGATIVE mg/dL
KETONES UR: NEGATIVE mg/dL
Nitrite: NEGATIVE
PH: 5 (ref 5.0–8.0)
Protein, ur: 30 mg/dL — AB
Specific Gravity, Urine: 1.026 (ref 1.005–1.030)

## 2017-03-15 LAB — CBC
HCT: 37.4 % (ref 36.0–46.0)
Hemoglobin: 12.4 g/dL (ref 12.0–15.0)
MCH: 30 pg (ref 26.0–34.0)
MCHC: 33.2 g/dL (ref 30.0–36.0)
MCV: 90.6 fL (ref 78.0–100.0)
PLATELETS: 242 10*3/uL (ref 150–400)
RBC: 4.13 MIL/uL (ref 3.87–5.11)
RDW: 13.3 % (ref 11.5–15.5)
WBC: 7.6 10*3/uL (ref 4.0–10.5)

## 2017-03-15 LAB — LIPASE, BLOOD: LIPASE: 31 U/L (ref 11–51)

## 2017-03-15 MED ORDER — ONDANSETRON HCL 4 MG/2ML IJ SOLN
4.0000 mg | Freq: Once | INTRAMUSCULAR | Status: AC
  Administered 2017-03-15: 4 mg via INTRAVENOUS
  Filled 2017-03-15: qty 2

## 2017-03-15 MED ORDER — IBUPROFEN 800 MG PO TABS: 800.0000 mg | ORAL_TABLET | Freq: Three times a day (TID) | ORAL | 0 refills | Status: AC

## 2017-03-15 MED ORDER — MORPHINE SULFATE (PF) 4 MG/ML IV SOLN
4.0000 mg | Freq: Once | INTRAVENOUS | Status: AC
  Administered 2017-03-15: 4 mg via INTRAVENOUS
  Filled 2017-03-15: qty 1

## 2017-03-15 MED ORDER — ONDANSETRON 4 MG PO TBDP
4.0000 mg | ORAL_TABLET | Freq: Once | ORAL | Status: AC
  Administered 2017-03-15: 4 mg via ORAL

## 2017-03-15 MED ORDER — ONDANSETRON 4 MG PO TBDP
ORAL_TABLET | ORAL | Status: AC
  Filled 2017-03-15: qty 1

## 2017-03-15 MED ORDER — ONDANSETRON 4 MG PO TBDP: 4.0000 mg | ORAL_TABLET | Freq: Three times a day (TID) | ORAL | 0 refills | Status: AC | PRN

## 2017-03-15 MED ORDER — OXYCODONE-ACETAMINOPHEN 5-325 MG PO TABS
1.0000 | ORAL_TABLET | Freq: Once | ORAL | Status: AC
  Administered 2017-03-15: 1 via ORAL
  Filled 2017-03-15: qty 1

## 2017-03-15 MED ORDER — SODIUM CHLORIDE 0.9 % IV BOLUS (SEPSIS)
1000.0000 mL | Freq: Once | INTRAVENOUS | Status: AC
  Administered 2017-03-15: 1000 mL via INTRAVENOUS

## 2017-03-15 MED ORDER — TAMSULOSIN HCL 0.4 MG PO CAPS: 0.4000 mg | ORAL_CAPSULE | Freq: Every day | ORAL | 0 refills | Status: AC

## 2017-03-15 MED ORDER — KETOROLAC TROMETHAMINE 30 MG/ML IJ SOLN
30.0000 mg | Freq: Once | INTRAMUSCULAR | Status: AC
  Administered 2017-03-15: 30 mg via INTRAVENOUS
  Filled 2017-03-15: qty 1

## 2017-03-15 MED ORDER — OXYCODONE-ACETAMINOPHEN 5-325 MG PO TABS: 1.0000 | ORAL_TABLET | ORAL | 0 refills | Status: AC | PRN

## 2017-03-15 NOTE — ED Triage Notes (Signed)
Pt complaining of R lower abdominal pain. Pt states 1 episodes of emesis tonight. Pt denies any diarrhea, urinary symptoms or vaginal bleeding/discharge. Pt denies any fevers/chills.

## 2017-03-15 NOTE — ED Notes (Signed)
Pt's family member up to nurse first desk requesting update, provided answers to questions and apologized for wait times.

## 2017-03-15 NOTE — ED Notes (Signed)
Patient transported to CT scan . 

## 2017-03-15 NOTE — Discharge Instructions (Signed)
Take the pain and nausea medications as prescribed. Follow-up with the urologist. Return to the ED if you develop worsening pain, vomiting, fever or any other concerns.

## 2017-03-15 NOTE — ED Notes (Signed)
Pt given Ginger-Ale to drink 

## 2017-03-15 NOTE — ED Provider Notes (Signed)
[facility]-EMERGENCY DEPT Provider Note   CSN: [id number] Arrival date & time: 03/15/17  0136  By signing my name below, I, Diona BrownerJennifer Gorman, attest that this documentation has been prepared under the direction and in the presence of Maciah Feeback, Jeannett SeniorStephen, MD. Electronically Signed: Diona BrownerJennifer Gorman, ED Scribe. 03/15/17. 3:53 AM.  History   Chief Complaint Chief Complaint  Patient presents with  . Abdominal Pain    HPI Amber Kane is a 61 y.o. female who presents to the Emergency Department complaining of gradually worsening, constant, 9/10, right lower abdominal pain that started ~ midnight. Pain woke her up. Associated sx include nausea and vomiting. No modifying factors noted. No hx of kidney stones. Pt denies dysuria, hematuria, fever, back pain, dizziness, diarrhea and constipation.  PCP: Carilyn GoodpastureWillard, Jennifer, PA-C  The history is provided by the patient. No language interpreter was used.    Past Medical History:  Diagnosis Date  . Anxiety    hx of anxiety attacks   . Arthritis    bilaterla knees   . Depression    hx of postpartum depression  . Headache(784.0)   . Peripheral vascular disease (HCC)    bilateral leg impaired circulation per pt - not followed by MD   . PONV (postoperative nausea and vomiting)     Patient Active Problem List   Diagnosis Date Noted  . S/P bilateral knee replacement 10/12/2011    Past Surgical History:  Procedure Laterality Date  . BREAST BIOPSY  12/13/2009  . BREAST EXCISIONAL BIOPSY Right 1981  . BREAST SURGERY     right breast biopsy - benign   . OTHER SURGICAL HISTORY     D and C   . OTHER SURGICAL HISTORY     laparoscopic uterine surgery   . OTHER SURGICAL HISTORY     needle breast biopsy on right- benign   . OTHER SURGICAL HISTORY     arthroscopic right knee surgery   . TOTAL KNEE ARTHROPLASTY  10/12/2011   Procedure: TOTAL KNEE BILATERAL;  Surgeon: Shelda PalMatthew D Olin;  Location: [facility] ORS;  Service: Orthopedics;  Laterality: Bilateral;     OB History    No data available       Home Medications    Prior to Admission medications   Medication Sig Start Date End Date Taking? Authorizing Provider  cetirizine (ZYRTEC) 10 MG tablet Take 10 mg by mouth daily.     [provider]  ferrous sulfate 325 (65 FE) MG tablet Take 1 tablet (325 mg total) by mouth 3 (three) times daily after meals. 10/16/11 10/15/12  Lanney GinsBabish, Matthew, PA-C  Misc Natural Products (OSTEO BI-FLEX ADV JOINT SHIELD PO) Take 1 tablet by mouth daily.     [provider]  Multiple Vitamin (MULITIVITAMIN WITH MINERALS) TABS Take 1 tablet by mouth daily.     [provider]  rivaroxaban (XARELTO) 10 MG TABS tablet Take 1 tablet (10 mg total) by mouth daily at 6 PM. 10/19/11   Lanney GinsBabish, Matthew, PA-C    Family History Family History  Problem Relation Age of Onset  . Anesthesia problems Brother     Social History Social History  Substance Use Topics  . Smoking status: Never Smoker  . Smokeless tobacco: Never Used  . Alcohol use Yes     Comment: rare     Allergies   Patient has no known allergies.   Review of Systems Review of Systems   A complete 10 system review of systems was obtained and all systems are  negative except as noted in the HPI and PMH.   Physical Exam Updated Vital Signs BP (!) 157/77 (BP Location: Right Arm)   Pulse (!) 53   Temp 97.7 F (36.5 C)   Resp 16   LMP 09/16/2011   SpO2 98%   Physical Exam  Constitutional: She is oriented to person, place, and time. She appears well-developed and well-nourished. No distress.  Appears uncomfortable.    HENT:  Head: Normocephalic and atraumatic.  Mouth/Throat: Oropharynx is clear and moist. No oropharyngeal exudate.  Eyes: Conjunctivae and EOM are normal. Pupils are equal, round, and reactive to light.  Neck: Normal range of motion. Neck supple.  No meningismus. No CVA tenderness.  Cardiovascular: Normal rate, regular rhythm, normal heart sounds and  intact distal pulses.   No murmur heard. Pulmonary/Chest: Effort normal and breath sounds normal. No respiratory distress.  Abdominal: Soft. There is tenderness. There is no rebound and no guarding.  Right sided abdominal tenderness.   Musculoskeletal: Normal range of motion. She exhibits no edema or tenderness.  Neurological: She is alert and oriented to person, place, and time. No cranial nerve deficit. She exhibits normal muscle tone. Coordination normal.   5/5 strength throughout. CN 2-12 intact.Equal grip strength.   Skin: Skin is warm.  Psychiatric: She has a normal mood and affect. Her behavior is normal.  Nursing note and vitals reviewed.    ED Treatments / Results  DIAGNOSTIC STUDIES: Oxygen Saturation is 98% on RA, normal by my interpretation.   COORDINATION OF CARE: 3:53 AM-Discussed next steps with pt which includes taking nausea and pain medication. Pt also will be given fluids. Pt verbalized understanding and is agreeable with the plan.   Labs (all labs ordered are listed, but only abnormal results are displayed) Labs Reviewed  COMPREHENSIVE METABOLIC PANEL - Abnormal; Notable for the following:       Result Value   Glucose, Bld 134 (*)    Total Protein 6.2 (*)    ALT 13 (*)    All other components within normal limits  URINALYSIS, ROUTINE W REFLEX MICROSCOPIC - Abnormal; Notable for the following:    APPearance HAZY (*)    Hgb urine dipstick LARGE (*)    Protein, ur 30 (*)    Leukocytes, UA SMALL (*)    Squamous Epithelial / LPF 0-5 (*)    All other components within normal limits  LIPASE, BLOOD  CBC    EKG  EKG Interpretation None       Radiology Ct Renal Stone Study  Result Date: 03/15/2017 CLINICAL DATA:  Right lower quadrant pain with nausea and vomiting EXAM: CT ABDOMEN AND PELVIS WITHOUT CONTRAST TECHNIQUE: Multidetector CT imaging of the abdomen and pelvis was performed following the standard protocol without IV contrast. COMPARISON:  None.  FINDINGS: Lower chest: No pulmonary nodules or pleural effusion. No visible pericardial effusion. Hepatobiliary: Normal noncontrast appearance of the liver. No visible biliary dilatation. Normal gallbladder. Pancreas: Normal noncontrast appearance of the pancreas. No peripancreatic fluid collection. Spleen: Normal. Adrenals/Urinary Tract: --Adrenal glands: Normal. --Right kidney/ureter: There is moderate right hydroureteronephrosis with mild perinephric stranding. At the right ureterovesical junction, there is an obstructing stone that measures 4 x 3 x 4 mm. No other right-sided renal stones. --Left kidney/ureter: There is a left lower pole nonobstructing stone that measures 5 mm. --Urinary bladder: Unremarkable. Stomach/Bowel: There is no hiatal hernia. The stomach and duodenum are normal. There is no dilated small bowel or enteric inflammation. There is no colonic abnormality. The  appendix is normal. Vascular/Lymphatic: No abdominal aortic aneurysm or atherosclerotic calcification. No abdominal or pelvic lymphadenopathy. Reproductive: Normal uterus and ovaries. Musculoskeletal. No focal osseous lesion. Normal visualized extraperitoneal and extrathoracic soft tissues. IMPRESSION: 1. Right-sided obstructive uropathy with 4 x 3 x 4 mm right ureterovesical junction stone causing moderate right hydroureteronephrosis and perinephric stranding. 2. Nonobstructing left lower pole 5 mm renal calculus. Electronically Signed   By: Deatra Robinson M.D.   On: 03/15/2017 04:28    Procedures Procedures (including critical care time)  Medications Ordered in ED Medications  sodium chloride 0.9 % bolus 1,000 mL (not administered)  ondansetron (ZOFRAN) injection 4 mg (not administered)  morphine 4 MG/ML injection 4 mg (not administered)  ondansetron (ZOFRAN-ODT) disintegrating tablet 4 mg (4 mg Oral Given 03/15/17 0154)     Initial Impression / Assessment and Plan / ED Course  I have reviewed the triage vital signs and  the nursing notes.  Pertinent labs & imaging results that were available during my care of the patient were reviewed by me and considered in my medical decision making (see chart for details).     Right-sided abdominal pain that started around midnight. Associated with multiple episodes of nausea and vomiting. No diarrhea, urinary or vaginal symptoms. No fever.  Patient appears ill but nontoxic. Afebrile. Abdomen soft without peritoneal signs.  Urinalysis shows hematuria. CT scan confirms right sided ureteral stone at UVJ with hydronephrosis.  UA without infection. Culture sent.   Pain and nausea controlled in the ED.  Patient tolerating PO.  Discharge with symptom control, urology followup. Return precautions discussed included worsening pain, vomiting, or fever.   Final Clinical Impressions(s) / ED Diagnoses   Final diagnoses:  Ureteral colic    New Prescriptions New Prescriptions   No medications on file  I personally performed the services described in this documentation, which was scribed in my presence. The recorded information has been reviewed and is accurate.     Glynn Octave, MD 03/15/17 [phone]

## 2017-03-15 NOTE — ED Notes (Signed)
Spouse concerned for patient's pain - says she's going to pass out. Assessed patient, slightly diaphoretic, good color. Pulses 2+. See VS flowsheet. Refusing to sit.

## 2017-03-16 LAB — URINE CULTURE: Culture: NO GROWTH

## 2017-10-13 ENCOUNTER — Other Ambulatory Visit: Payer: Self-pay | Admitting: Family Medicine

## 2017-10-13 ENCOUNTER — Other Ambulatory Visit (HOSPITAL_COMMUNITY)
Admission: RE | Admit: 2017-10-13 | Discharge: 2017-10-13 | Disposition: A | Discharge status: Home / Self Care | Payer: BC Managed Care – PPO | Source: Ambulatory Visit | Attending: Family Medicine | Admitting: Family Medicine

## 2017-10-13 DIAGNOSIS — Z124 Encounter for screening for malignant neoplasm of cervix: Secondary | ICD-10-CM | POA: Insufficient documentation

## 2017-10-14 LAB — CYTOLOGY - PAP
DIAGNOSIS: NEGATIVE
HPV: NOT DETECTED

## 2017-12-02 ENCOUNTER — Other Ambulatory Visit: Payer: Self-pay | Admitting: Family Medicine

## 2017-12-02 DIAGNOSIS — Z1231 Encounter for screening mammogram for malignant neoplasm of breast: Secondary | ICD-10-CM

## 2017-12-17 ENCOUNTER — Ambulatory Visit
Admission: RE | Admit: 2017-12-17 | Discharge: 2017-12-17 | Disposition: A | Discharge status: Home / Self Care | Payer: BC Managed Care – PPO | Source: Ambulatory Visit | Attending: Family Medicine | Admitting: Family Medicine

## 2017-12-17 DIAGNOSIS — Z1231 Encounter for screening mammogram for malignant neoplasm of breast: Secondary | ICD-10-CM

## 2018-06-16 IMAGING — CT CT RENAL STONE PROTOCOL
2 of 4 series · 16 of 46 positions shown, 18 images · non-contrast
Comparison: None.

CLINICAL DATA: Right lower quadrant pain with nausea and vomiting

EXAM:
CT ABDOMEN AND PELVIS WITHOUT CONTRAST
TECHNIQUE: Multidetector CT imaging of the abdomen and pelvis was performed
following the standard protocol without IV contrast.

[Series 3: stone study 5.0 i30f 2 · axial · 0.84mm/px · z∈[-434,+6]mm · 13 of 96 slices shown, 15 images]
[im 4/96  soft-tissue]
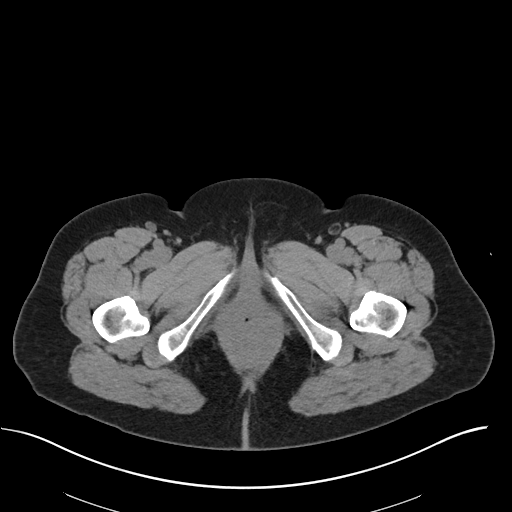
[im 4/96  bone]
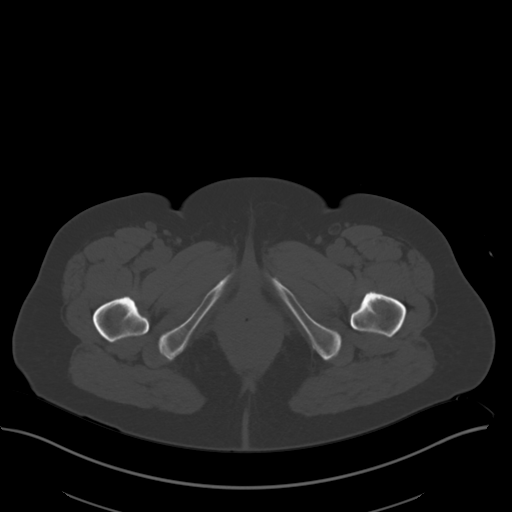
[im 11/96  soft-tissue]
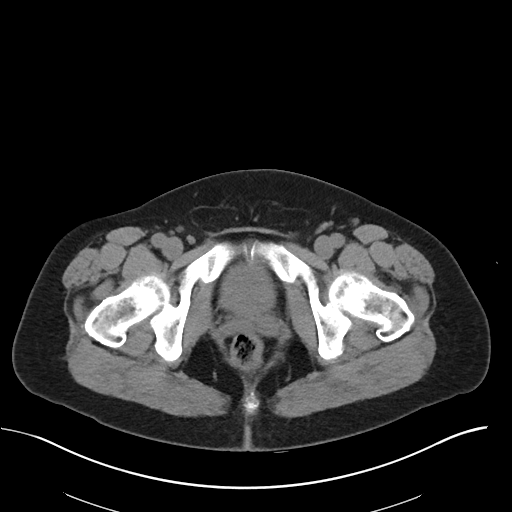
[im 19/96  soft-tissue]
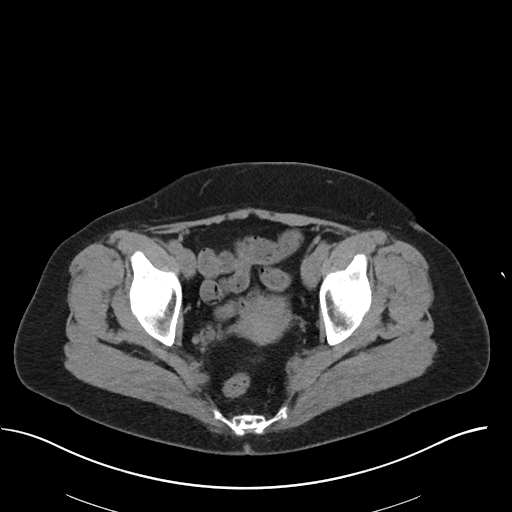
[im 26/96  soft-tissue]
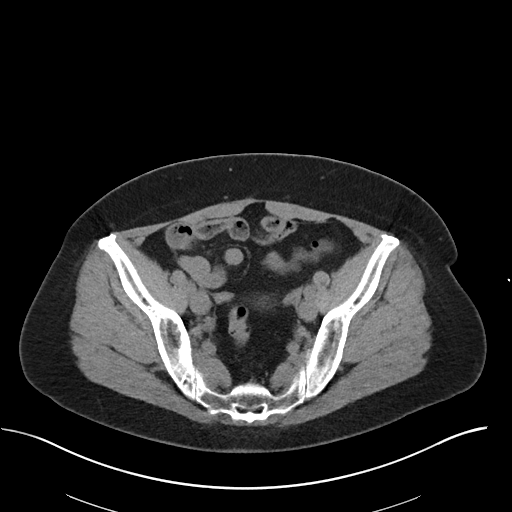
[im 33/96  soft-tissue]
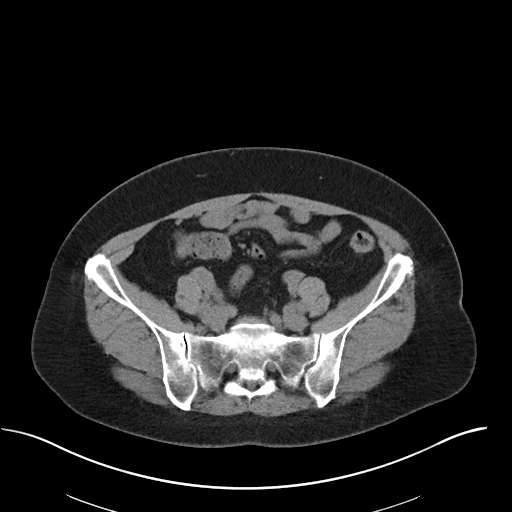
[im 41/96  soft-tissue]
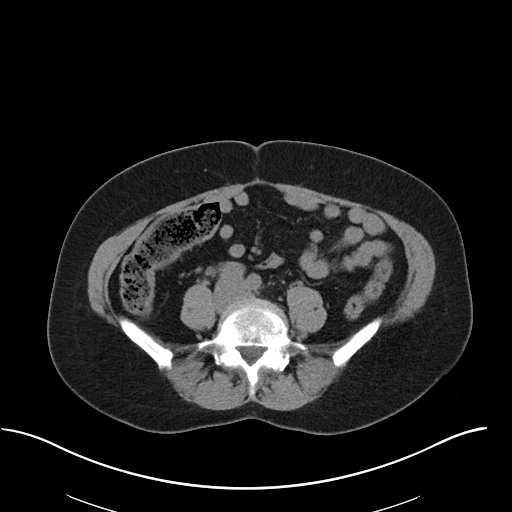
[im 48/96  soft-tissue]
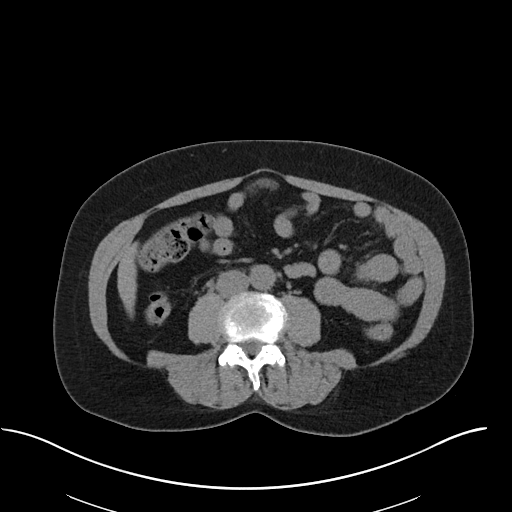
[im 55/96  soft-tissue]
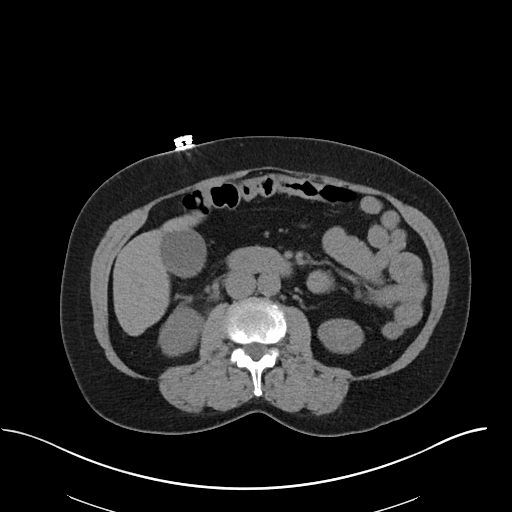
[im 63/96  soft-tissue]
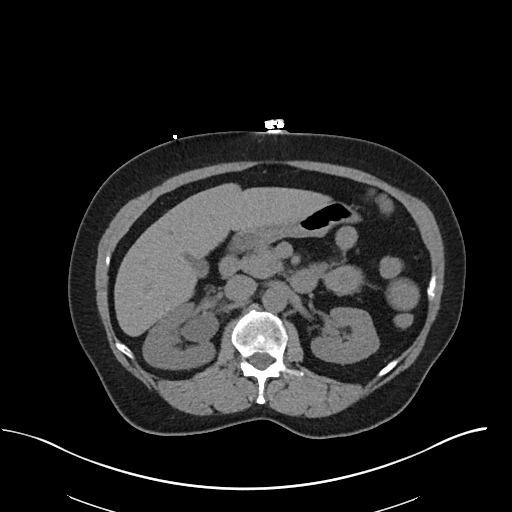
[im 63/96  bone]
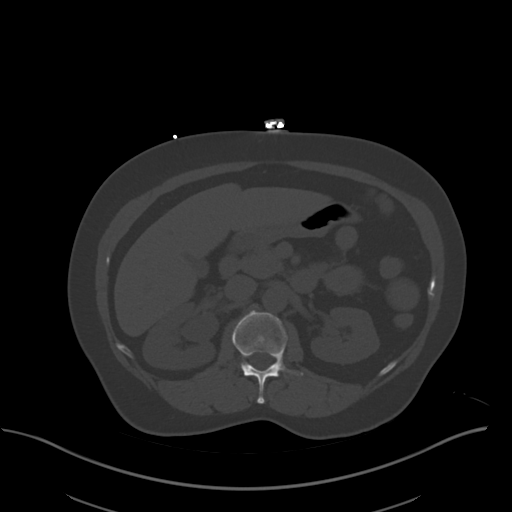
[im 70/96  soft-tissue]
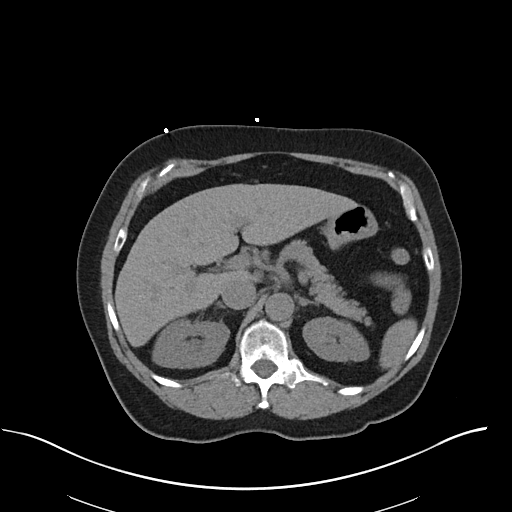
[im 77/96  soft-tissue]
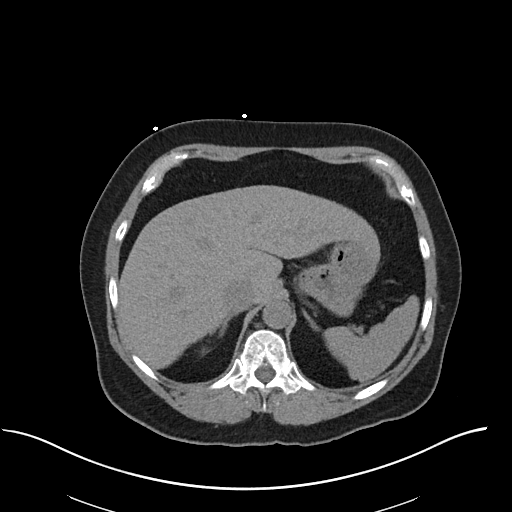
[im 85/96  soft-tissue]
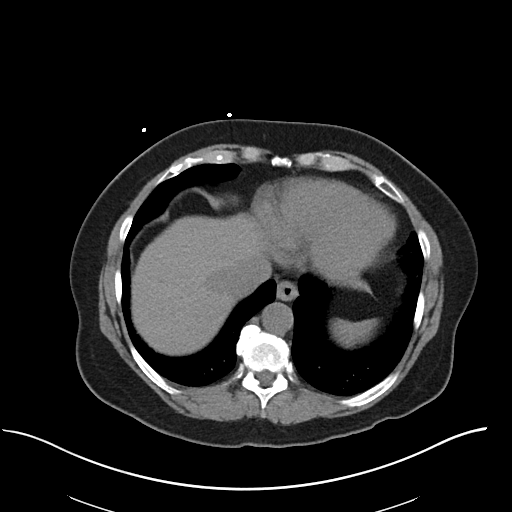
[im 92/96  soft-tissue]
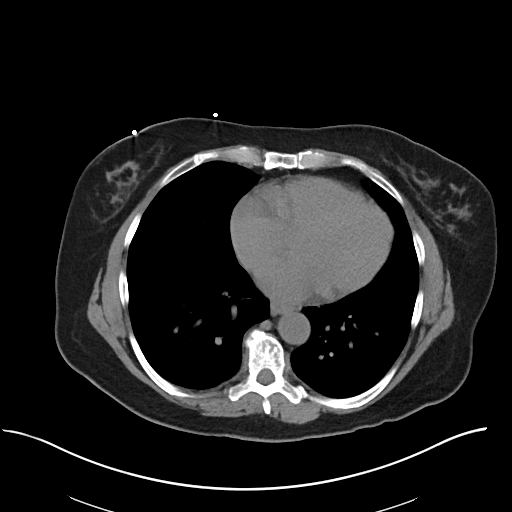

[Series 6: coronal soft tissue · coronal · 0.75mm/px · 3 of 84 slices shown]
[im 28/84  soft-tissue]
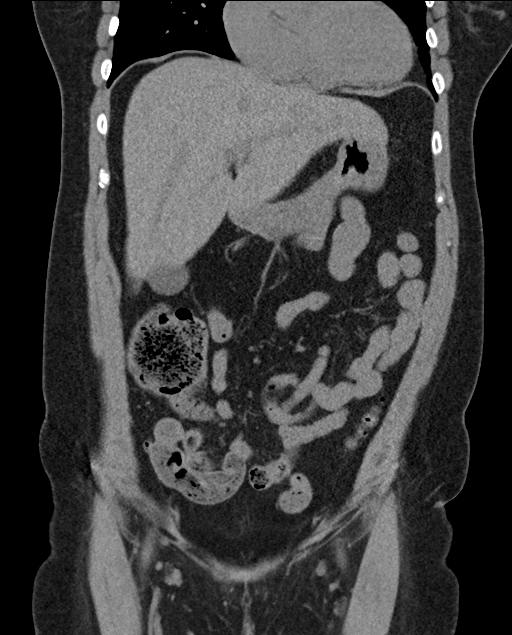
[im 37/84  soft-tissue]
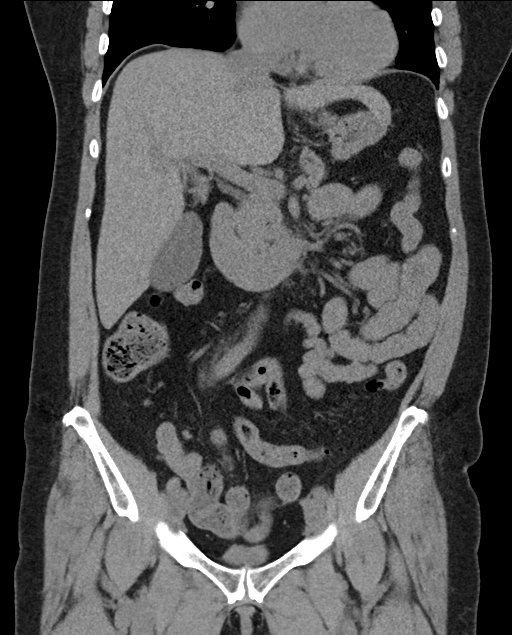
[im 47/84  soft-tissue]
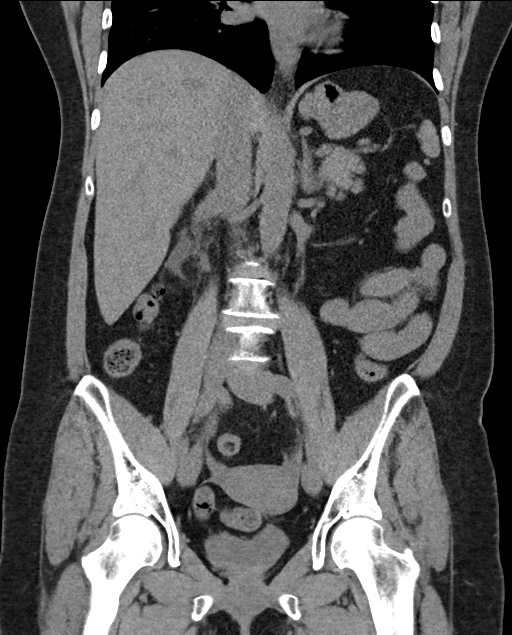

[16 of 46 positions shown; findings below may reference images not displayed]

FINDINGS: Lower chest: No pulmonary nodules or pleural effusion. No visible
pericardial effusion.

Hepatobiliary: Normal noncontrast appearance of the liver. No
visible biliary dilatation. Normal gallbladder.

Pancreas: Normal noncontrast appearance of the pancreas. No
peripancreatic fluid collection.

Spleen: Normal.

Adrenals/Urinary Tract:

--Adrenal glands: Normal.

--Right kidney/ureter: There is moderate right hydroureteronephrosis
with mild perinephric stranding. At the right ureterovesical
junction, there is an obstructing stone that measures 4 x 3 x 4 mm.
No other right-sided renal stones.

--Left kidney/ureter: There is a left lower pole nonobstructing
stone that measures 5 mm.

--Urinary bladder: Unremarkable.

Stomach/Bowel: There is no hiatal hernia. The stomach and duodenum
are normal. There is no dilated small bowel or enteric inflammation.
There is no colonic abnormality. The appendix is normal.

Vascular/Lymphatic: No abdominal aortic aneurysm or atherosclerotic
calcification. No abdominal or pelvic lymphadenopathy.

Reproductive: Normal uterus and ovaries.

Musculoskeletal. No focal osseous lesion. Normal visualized
extraperitoneal and extrathoracic soft tissues.
IMPRESSION: 1. Right-sided obstructive uropathy with 4 x 3 x 4 mm right
ureterovesical junction stone causing moderate right
hydroureteronephrosis and perinephric stranding.
2. Nonobstructing left lower pole 5 mm renal calculus.

## 2018-12-05 ENCOUNTER — Other Ambulatory Visit: Payer: Self-pay | Admitting: Family Medicine

## 2018-12-05 DIAGNOSIS — Z1231 Encounter for screening mammogram for malignant neoplasm of breast: Secondary | ICD-10-CM

## 2018-12-29 ENCOUNTER — Ambulatory Visit: Payer: BC Managed Care – PPO

## 2019-03-17 ENCOUNTER — Ambulatory Visit: Payer: BC Managed Care – PPO

## 2019-04-21 ENCOUNTER — Other Ambulatory Visit: Payer: Self-pay

## 2019-04-21 ENCOUNTER — Ambulatory Visit
Admission: RE | Admit: 2019-04-21 | Discharge: 2019-04-21 | Disposition: A | Discharge status: Home / Self Care | Payer: BC Managed Care – PPO | Source: Ambulatory Visit | Attending: Family Medicine | Admitting: Family Medicine

## 2019-04-21 DIAGNOSIS — Z1231 Encounter for screening mammogram for malignant neoplasm of breast: Secondary | ICD-10-CM

## 2020-03-13 ENCOUNTER — Other Ambulatory Visit: Payer: Self-pay | Admitting: Family Medicine

## 2020-03-13 DIAGNOSIS — Z1231 Encounter for screening mammogram for malignant neoplasm of breast: Secondary | ICD-10-CM

## 2020-04-22 ENCOUNTER — Ambulatory Visit: Payer: BC Managed Care – PPO

## 2020-04-30 ENCOUNTER — Ambulatory Visit
Admission: RE | Admit: 2020-04-30 | Discharge: 2020-04-30 | Disposition: A | Discharge status: Home / Self Care | Payer: BC Managed Care – PPO | Source: Ambulatory Visit | Attending: Family Medicine | Admitting: Family Medicine

## 2020-04-30 ENCOUNTER — Other Ambulatory Visit: Payer: Self-pay

## 2020-04-30 DIAGNOSIS — Z1231 Encounter for screening mammogram for malignant neoplasm of breast: Secondary | ICD-10-CM

## 2021-01-15 DIAGNOSIS — J019 Acute sinusitis, unspecified: Secondary | ICD-10-CM | POA: Diagnosis not present

## 2021-04-23 DIAGNOSIS — Z0000 Encounter for general adult medical examination without abnormal findings: Secondary | ICD-10-CM | POA: Diagnosis not present

## 2021-04-23 DIAGNOSIS — E785 Hyperlipidemia, unspecified: Secondary | ICD-10-CM | POA: Diagnosis not present

## 2021-04-23 DIAGNOSIS — R5382 Chronic fatigue, unspecified: Secondary | ICD-10-CM | POA: Diagnosis not present

## 2021-04-23 DIAGNOSIS — Z5181 Encounter for therapeutic drug level monitoring: Secondary | ICD-10-CM | POA: Diagnosis not present

## 2021-04-23 DIAGNOSIS — Z23 Encounter for immunization: Secondary | ICD-10-CM | POA: Diagnosis not present

## 2021-04-24 ENCOUNTER — Other Ambulatory Visit: Payer: Self-pay | Admitting: Family Medicine

## 2021-04-24 DIAGNOSIS — R221 Localized swelling, mass and lump, neck: Secondary | ICD-10-CM

## 2021-04-29 ENCOUNTER — Other Ambulatory Visit: Payer: Self-pay | Admitting: Family Medicine

## 2021-04-29 DIAGNOSIS — Z1231 Encounter for screening mammogram for malignant neoplasm of breast: Secondary | ICD-10-CM

## 2021-05-02 ENCOUNTER — Other Ambulatory Visit: Payer: Self-pay

## 2021-05-02 ENCOUNTER — Ambulatory Visit
Admission: RE | Admit: 2021-05-02 | Discharge: 2021-05-02 | Disposition: A | Discharge status: Home / Self Care | Payer: Medicare Other | Source: Ambulatory Visit | Attending: Family Medicine | Admitting: Family Medicine

## 2021-05-02 DIAGNOSIS — R221 Localized swelling, mass and lump, neck: Secondary | ICD-10-CM

## 2021-05-02 DIAGNOSIS — Z1231 Encounter for screening mammogram for malignant neoplasm of breast: Secondary | ICD-10-CM

## 2021-06-02 DIAGNOSIS — M25561 Pain in right knee: Secondary | ICD-10-CM | POA: Diagnosis not present

## 2021-06-02 DIAGNOSIS — M25551 Pain in right hip: Secondary | ICD-10-CM | POA: Diagnosis not present

## 2021-09-12 DIAGNOSIS — U071 COVID-19: Secondary | ICD-10-CM | POA: Diagnosis not present

## 2021-09-12 DIAGNOSIS — J3489 Other specified disorders of nose and nasal sinuses: Secondary | ICD-10-CM | POA: Diagnosis not present

## 2021-09-12 DIAGNOSIS — J029 Acute pharyngitis, unspecified: Secondary | ICD-10-CM | POA: Diagnosis not present

## 2021-12-11 DIAGNOSIS — S52502A Unspecified fracture of the lower end of left radius, initial encounter for closed fracture: Secondary | ICD-10-CM | POA: Diagnosis not present

## 2021-12-11 DIAGNOSIS — S52572A Other intraarticular fracture of lower end of left radius, initial encounter for closed fracture: Secondary | ICD-10-CM | POA: Diagnosis not present

## 2021-12-11 DIAGNOSIS — W1839XA Other fall on same level, initial encounter: Secondary | ICD-10-CM | POA: Diagnosis not present

## 2021-12-11 DIAGNOSIS — S0081XA Abrasion of other part of head, initial encounter: Secondary | ICD-10-CM | POA: Diagnosis not present

## 2021-12-11 DIAGNOSIS — S60031A Contusion of right middle finger without damage to nail, initial encounter: Secondary | ICD-10-CM | POA: Diagnosis not present

## 2021-12-11 DIAGNOSIS — S6392XA Sprain of unspecified part of left wrist and hand, initial encounter: Secondary | ICD-10-CM | POA: Diagnosis not present

## 2021-12-11 DIAGNOSIS — S6991XA Unspecified injury of right wrist, hand and finger(s), initial encounter: Secondary | ICD-10-CM | POA: Diagnosis not present

## 2021-12-11 DIAGNOSIS — S93401A Sprain of unspecified ligament of right ankle, initial encounter: Secondary | ICD-10-CM | POA: Diagnosis not present

## 2021-12-11 DIAGNOSIS — R030 Elevated blood-pressure reading, without diagnosis of hypertension: Secondary | ICD-10-CM | POA: Diagnosis not present

## 2021-12-25 DIAGNOSIS — S52502A Unspecified fracture of the lower end of left radius, initial encounter for closed fracture: Secondary | ICD-10-CM | POA: Diagnosis not present

## 2022-01-01 DIAGNOSIS — M25532 Pain in left wrist: Secondary | ICD-10-CM | POA: Diagnosis not present

## 2022-01-01 DIAGNOSIS — S52502A Unspecified fracture of the lower end of left radius, initial encounter for closed fracture: Secondary | ICD-10-CM | POA: Diagnosis not present

## 2022-01-05 DIAGNOSIS — Z96653 Presence of artificial knee joint, bilateral: Secondary | ICD-10-CM | POA: Diagnosis not present

## 2022-01-22 DIAGNOSIS — S52502A Unspecified fracture of the lower end of left radius, initial encounter for closed fracture: Secondary | ICD-10-CM | POA: Diagnosis not present

## 2022-01-22 DIAGNOSIS — M25532 Pain in left wrist: Secondary | ICD-10-CM | POA: Diagnosis not present

## 2022-01-23 DIAGNOSIS — M25532 Pain in left wrist: Secondary | ICD-10-CM | POA: Diagnosis not present

## 2022-01-26 DIAGNOSIS — M25532 Pain in left wrist: Secondary | ICD-10-CM | POA: Diagnosis not present

## 2022-01-28 DIAGNOSIS — M25532 Pain in left wrist: Secondary | ICD-10-CM | POA: Diagnosis not present

## 2022-03-23 DIAGNOSIS — S52502A Unspecified fracture of the lower end of left radius, initial encounter for closed fracture: Secondary | ICD-10-CM | POA: Diagnosis not present

## 2022-03-23 DIAGNOSIS — M25532 Pain in left wrist: Secondary | ICD-10-CM | POA: Diagnosis not present

## 2022-04-01 DIAGNOSIS — M25532 Pain in left wrist: Secondary | ICD-10-CM | POA: Diagnosis not present

## 2022-04-07 DIAGNOSIS — M25532 Pain in left wrist: Secondary | ICD-10-CM | POA: Diagnosis not present

## 2022-04-09 ENCOUNTER — Other Ambulatory Visit: Payer: Self-pay | Admitting: Family Medicine

## 2022-04-09 DIAGNOSIS — Z1231 Encounter for screening mammogram for malignant neoplasm of breast: Secondary | ICD-10-CM

## 2022-04-16 DIAGNOSIS — M25532 Pain in left wrist: Secondary | ICD-10-CM | POA: Diagnosis not present

## 2022-04-20 DIAGNOSIS — M25532 Pain in left wrist: Secondary | ICD-10-CM | POA: Diagnosis not present

## 2022-05-08 ENCOUNTER — Ambulatory Visit: Payer: Medicare Other

## 2022-05-12 ENCOUNTER — Other Ambulatory Visit: Payer: Self-pay | Admitting: Family Medicine

## 2022-05-13 DIAGNOSIS — Z0000 Encounter for general adult medical examination without abnormal findings: Secondary | ICD-10-CM | POA: Diagnosis not present

## 2022-05-13 DIAGNOSIS — Z5181 Encounter for therapeutic drug level monitoring: Secondary | ICD-10-CM | POA: Diagnosis not present

## 2022-05-13 DIAGNOSIS — E782 Mixed hyperlipidemia: Secondary | ICD-10-CM | POA: Diagnosis not present

## 2022-05-14 ENCOUNTER — Ambulatory Visit: Payer: Medicare Other

## 2022-05-14 ENCOUNTER — Other Ambulatory Visit: Payer: Self-pay | Admitting: Family Medicine

## 2022-05-14 DIAGNOSIS — E782 Mixed hyperlipidemia: Secondary | ICD-10-CM

## 2022-05-19 ENCOUNTER — Other Ambulatory Visit: Payer: Self-pay | Admitting: Family Medicine

## 2022-05-19 DIAGNOSIS — E2839 Other primary ovarian failure: Secondary | ICD-10-CM

## 2022-06-09 ENCOUNTER — Ambulatory Visit
Admission: RE | Admit: 2022-06-09 | Discharge: 2022-06-09 | Disposition: A | Discharge status: Home / Self Care | Payer: Medicare Other | Source: Ambulatory Visit | Attending: Family Medicine | Admitting: Family Medicine

## 2022-06-09 DIAGNOSIS — Z1231 Encounter for screening mammogram for malignant neoplasm of breast: Secondary | ICD-10-CM | POA: Diagnosis not present

## 2022-06-10 DIAGNOSIS — Z23 Encounter for immunization: Secondary | ICD-10-CM | POA: Diagnosis not present

## 2022-06-12 ENCOUNTER — Ambulatory Visit
Admission: RE | Admit: 2022-06-12 | Discharge: 2022-06-12 | Disposition: A | Discharge status: Home / Self Care | Payer: Medicare Other | Source: Ambulatory Visit | Attending: Family Medicine | Admitting: Family Medicine

## 2022-06-12 DIAGNOSIS — M8588 Other specified disorders of bone density and structure, other site: Secondary | ICD-10-CM | POA: Diagnosis not present

## 2022-06-12 DIAGNOSIS — E2839 Other primary ovarian failure: Secondary | ICD-10-CM

## 2022-06-12 DIAGNOSIS — Z780 Asymptomatic menopausal state: Secondary | ICD-10-CM | POA: Diagnosis not present

## 2022-06-12 DIAGNOSIS — M810 Age-related osteoporosis without current pathological fracture: Secondary | ICD-10-CM | POA: Diagnosis not present

## 2022-06-23 DIAGNOSIS — E559 Vitamin D deficiency, unspecified: Secondary | ICD-10-CM | POA: Diagnosis not present

## 2022-08-03 IMAGING — MG DIGITAL SCREENING BILAT W/ CAD
4 series · 4 of 4 positions shown · non-contrast
Comparison: Previous exam(s).

CLINICAL DATA: Screening.

EXAM:
DIGITAL SCREENING BILATERAL MAMMOGRAM WITH CAD
TECHNIQUE: Bilateral screening digital craniocaudal and mediolateral oblique
mammograms were obtained. The images were evaluated with
computer-aided detection.

[R MLO]
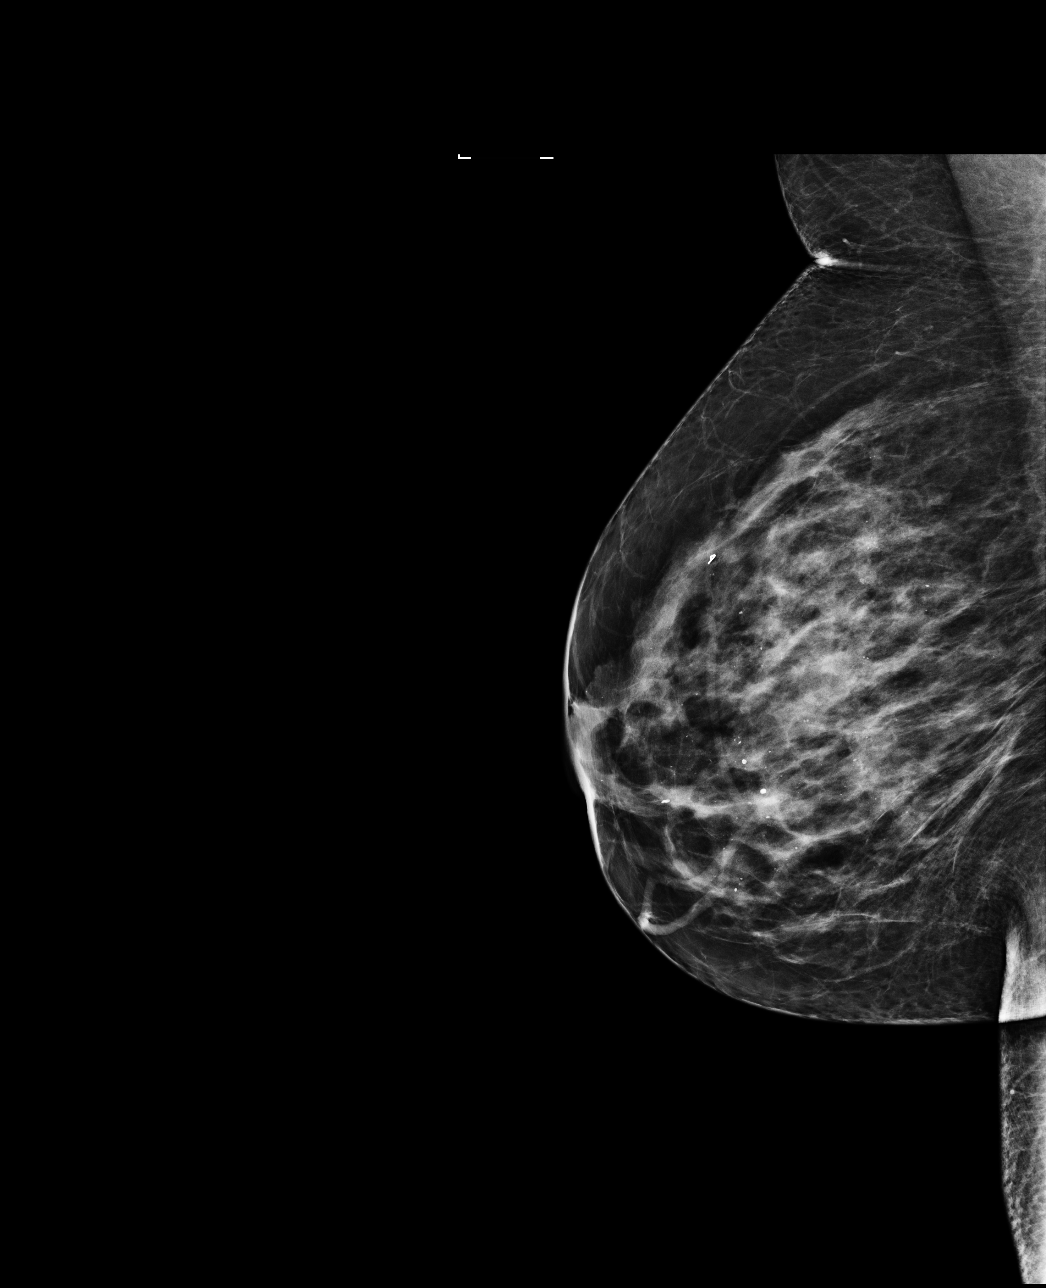

[L MLO]
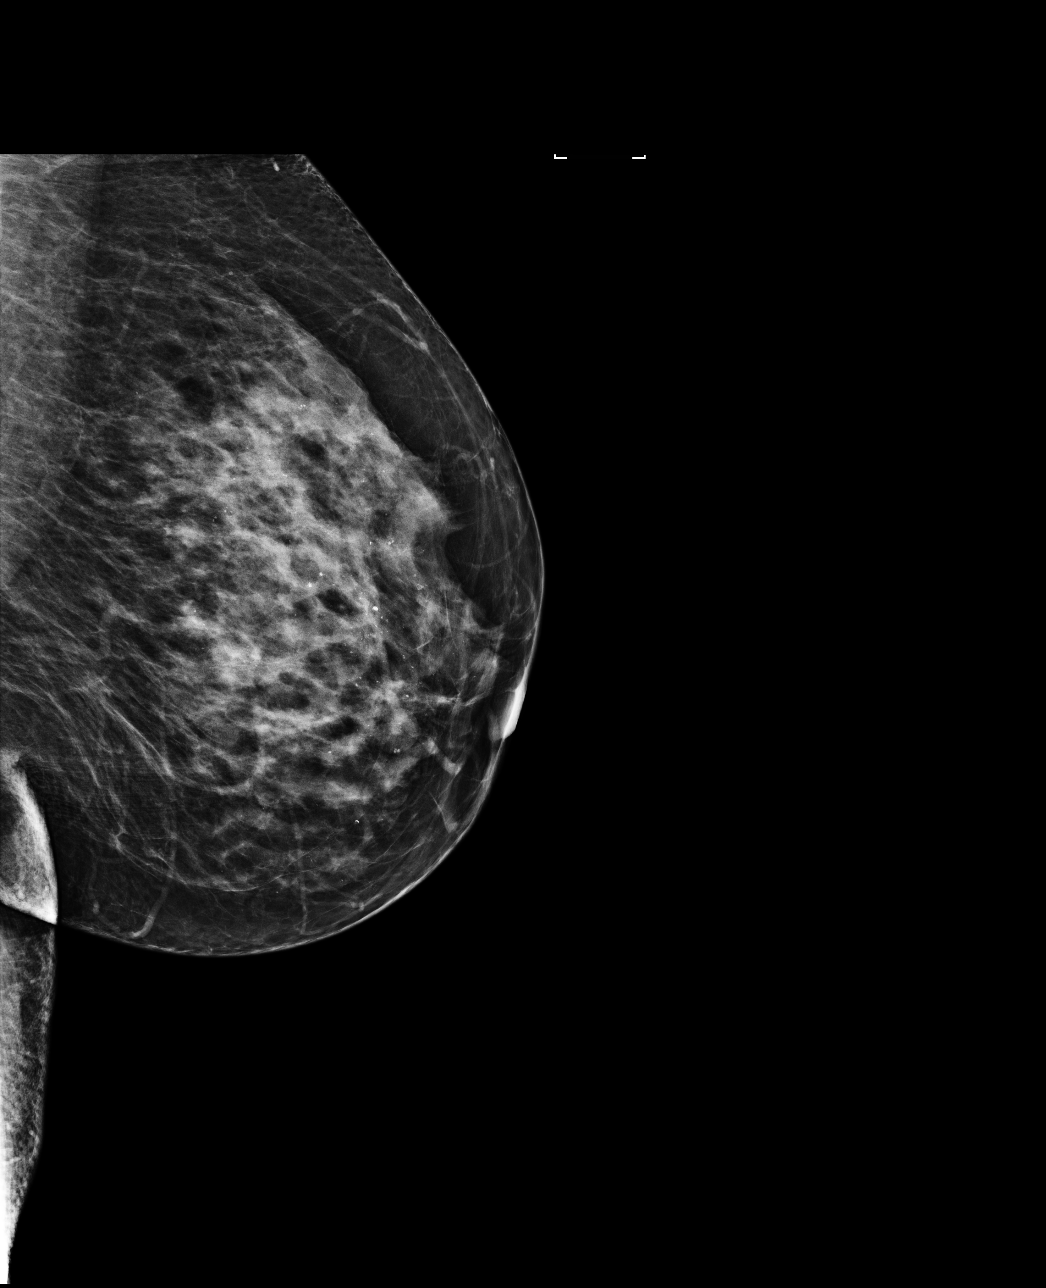

[R CC]
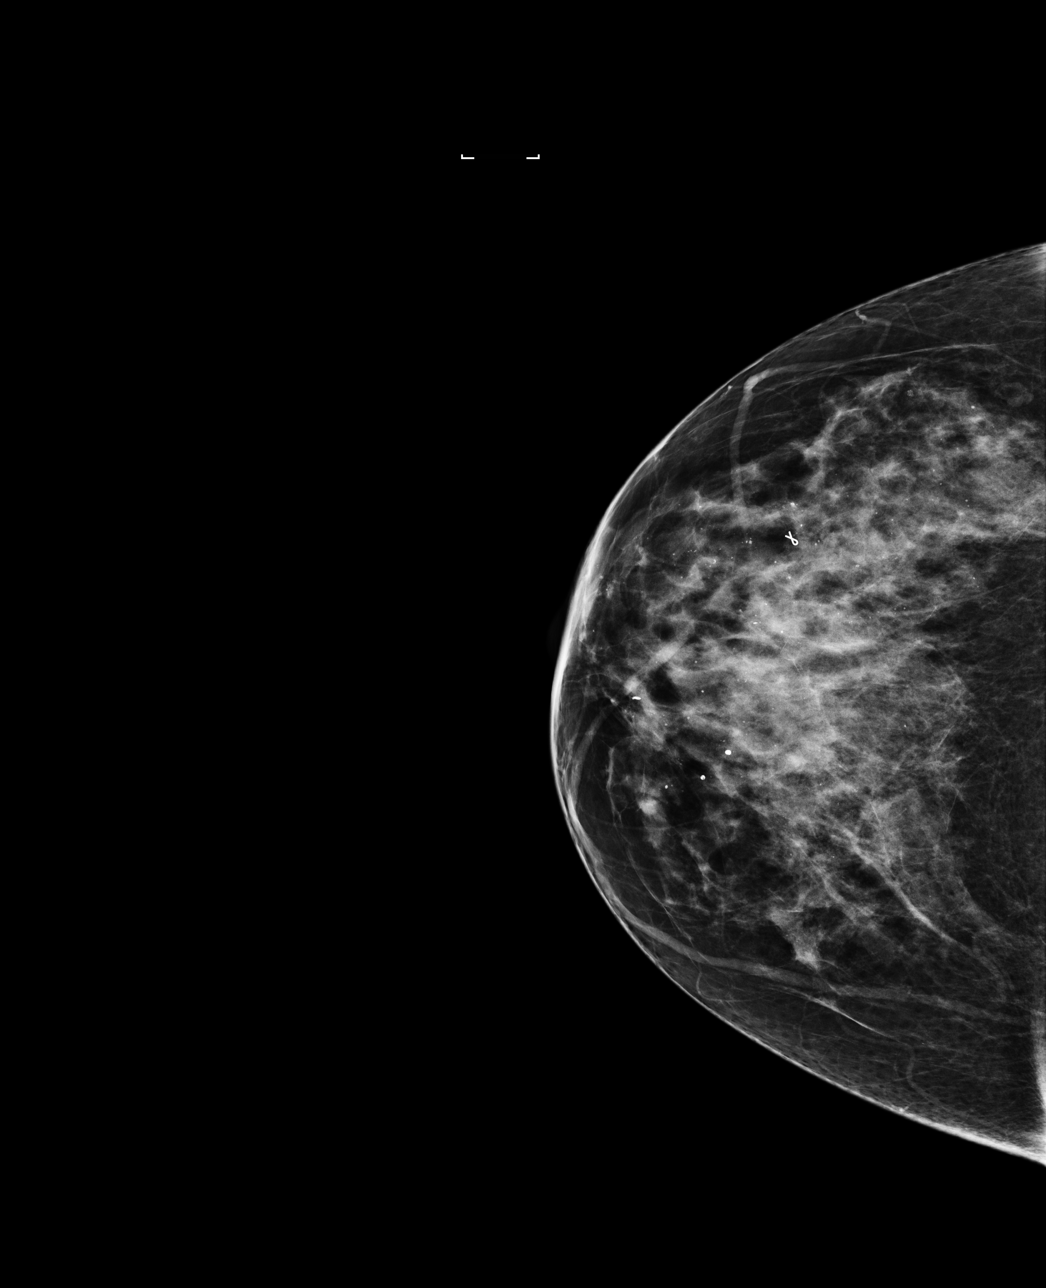

[L CC]
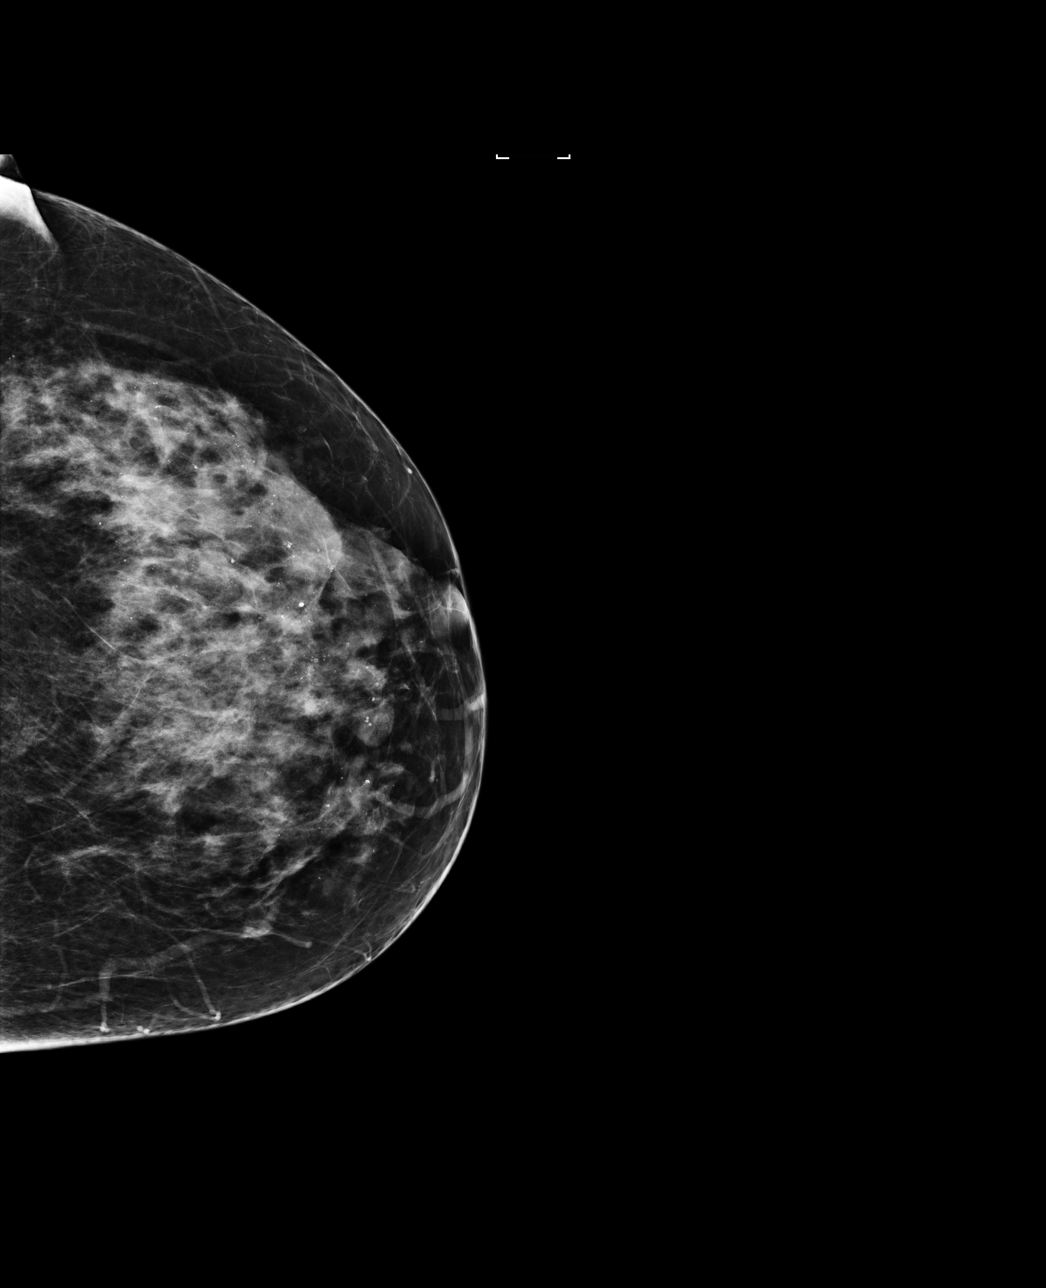

[4 of 4 positions shown; findings below may reference images not displayed]

ACR Breast Density Category d: The breast tissue is extremely dense,
which lowers the sensitivity of mammography
FINDINGS: There are no findings suspicious for malignancy.
IMPRESSION: No mammographic evidence of malignancy. A result letter of this
screening mammogram will be mailed directly to the patient.

RECOMMENDATION:
Screening mammogram in one year. (Code:NR-Q-H61)

BI-RADS CATEGORY  1: Negative.

## 2022-08-05 ENCOUNTER — Ambulatory Visit
Admission: RE | Admit: 2022-08-05 | Discharge: 2022-08-05 | Disposition: A | Discharge status: Home / Self Care | Payer: No Typology Code available for payment source | Source: Ambulatory Visit | Attending: Family Medicine | Admitting: Family Medicine

## 2022-08-05 DIAGNOSIS — I700 Atherosclerosis of aorta: Secondary | ICD-10-CM | POA: Diagnosis not present

## 2022-08-05 DIAGNOSIS — E782 Mixed hyperlipidemia: Secondary | ICD-10-CM

## 2022-08-17 DIAGNOSIS — M816 Localized osteoporosis [Lequesne]: Secondary | ICD-10-CM | POA: Diagnosis not present

## 2022-08-17 DIAGNOSIS — E559 Vitamin D deficiency, unspecified: Secondary | ICD-10-CM | POA: Diagnosis not present

## 2023-01-01 DIAGNOSIS — H109 Unspecified conjunctivitis: Secondary | ICD-10-CM | POA: Diagnosis not present

## 2023-01-01 DIAGNOSIS — J019 Acute sinusitis, unspecified: Secondary | ICD-10-CM | POA: Diagnosis not present

## 2023-01-01 DIAGNOSIS — J209 Acute bronchitis, unspecified: Secondary | ICD-10-CM | POA: Diagnosis not present

## 2023-01-14 DIAGNOSIS — J069 Acute upper respiratory infection, unspecified: Secondary | ICD-10-CM | POA: Diagnosis not present

## 2023-01-14 DIAGNOSIS — R051 Acute cough: Secondary | ICD-10-CM | POA: Diagnosis not present

## 2023-04-30 ENCOUNTER — Other Ambulatory Visit: Payer: Self-pay | Admitting: Family Medicine

## 2023-04-30 DIAGNOSIS — Z1231 Encounter for screening mammogram for malignant neoplasm of breast: Secondary | ICD-10-CM

## 2023-05-13 DIAGNOSIS — R1031 Right lower quadrant pain: Secondary | ICD-10-CM | POA: Diagnosis not present

## 2023-05-13 DIAGNOSIS — N200 Calculus of kidney: Secondary | ICD-10-CM | POA: Diagnosis not present

## 2023-05-13 DIAGNOSIS — N3001 Acute cystitis with hematuria: Secondary | ICD-10-CM | POA: Diagnosis not present

## 2023-06-03 DIAGNOSIS — R3914 Feeling of incomplete bladder emptying: Secondary | ICD-10-CM | POA: Diagnosis not present

## 2023-06-03 DIAGNOSIS — N200 Calculus of kidney: Secondary | ICD-10-CM | POA: Diagnosis not present

## 2023-06-03 DIAGNOSIS — N3000 Acute cystitis without hematuria: Secondary | ICD-10-CM | POA: Diagnosis not present

## 2023-06-17 DIAGNOSIS — N202 Calculus of kidney with calculus of ureter: Secondary | ICD-10-CM | POA: Diagnosis not present

## 2023-06-17 DIAGNOSIS — N200 Calculus of kidney: Secondary | ICD-10-CM | POA: Diagnosis not present

## 2023-06-21 ENCOUNTER — Ambulatory Visit
Admission: RE | Admit: 2023-06-21 | Discharge: 2023-06-21 | Disposition: A | Discharge status: Home / Self Care | Payer: Medicare Other | Source: Ambulatory Visit | Attending: Family Medicine | Admitting: Family Medicine

## 2023-06-21 DIAGNOSIS — Z1231 Encounter for screening mammogram for malignant neoplasm of breast: Secondary | ICD-10-CM

## 2023-06-22 DIAGNOSIS — N202 Calculus of kidney with calculus of ureter: Secondary | ICD-10-CM | POA: Diagnosis not present

## 2023-06-23 ENCOUNTER — Other Ambulatory Visit (HOSPITAL_COMMUNITY): Payer: Self-pay | Admitting: Family Medicine

## 2023-06-23 DIAGNOSIS — R1011 Right upper quadrant pain: Secondary | ICD-10-CM

## 2023-06-23 DIAGNOSIS — Z23 Encounter for immunization: Secondary | ICD-10-CM | POA: Diagnosis not present

## 2023-06-23 DIAGNOSIS — R1031 Right lower quadrant pain: Secondary | ICD-10-CM

## 2023-06-24 ENCOUNTER — Ambulatory Visit (HOSPITAL_COMMUNITY)
Admission: RE | Admit: 2023-06-24 | Discharge: 2023-06-24 | Disposition: A | Discharge status: Home / Self Care | Payer: Medicare Other | Source: Ambulatory Visit | Attending: Family Medicine | Admitting: Family Medicine

## 2023-06-24 DIAGNOSIS — R1011 Right upper quadrant pain: Secondary | ICD-10-CM | POA: Diagnosis not present

## 2023-06-24 DIAGNOSIS — N1330 Unspecified hydronephrosis: Secondary | ICD-10-CM | POA: Diagnosis not present

## 2023-06-24 DIAGNOSIS — R1031 Right lower quadrant pain: Secondary | ICD-10-CM | POA: Insufficient documentation

## 2023-06-25 DIAGNOSIS — E559 Vitamin D deficiency, unspecified: Secondary | ICD-10-CM | POA: Diagnosis not present

## 2023-06-25 DIAGNOSIS — E782 Mixed hyperlipidemia: Secondary | ICD-10-CM | POA: Diagnosis not present

## 2023-06-28 DIAGNOSIS — R1031 Right lower quadrant pain: Secondary | ICD-10-CM | POA: Diagnosis not present

## 2023-06-28 DIAGNOSIS — Z0000 Encounter for general adult medical examination without abnormal findings: Secondary | ICD-10-CM | POA: Diagnosis not present

## 2023-06-28 DIAGNOSIS — R1011 Right upper quadrant pain: Secondary | ICD-10-CM | POA: Diagnosis not present

## 2023-06-28 DIAGNOSIS — N130 Hydronephrosis with ureteropelvic junction obstruction: Secondary | ICD-10-CM | POA: Diagnosis not present

## 2023-06-28 DIAGNOSIS — N200 Calculus of kidney: Secondary | ICD-10-CM | POA: Diagnosis not present

## 2023-06-28 DIAGNOSIS — M816 Localized osteoporosis [Lequesne]: Secondary | ICD-10-CM | POA: Diagnosis not present

## 2023-06-28 DIAGNOSIS — Z1211 Encounter for screening for malignant neoplasm of colon: Secondary | ICD-10-CM | POA: Diagnosis not present

## 2023-07-26 DIAGNOSIS — N1330 Unspecified hydronephrosis: Secondary | ICD-10-CM | POA: Diagnosis not present

## 2023-07-26 DIAGNOSIS — K7689 Other specified diseases of liver: Secondary | ICD-10-CM | POA: Diagnosis not present

## 2023-07-26 DIAGNOSIS — N202 Calculus of kidney with calculus of ureter: Secondary | ICD-10-CM | POA: Diagnosis not present

## 2023-07-26 DIAGNOSIS — K5730 Diverticulosis of large intestine without perforation or abscess without bleeding: Secondary | ICD-10-CM | POA: Diagnosis not present

## 2023-09-28 DIAGNOSIS — Z1211 Encounter for screening for malignant neoplasm of colon: Secondary | ICD-10-CM | POA: Diagnosis not present

## 2023-09-28 DIAGNOSIS — K5730 Diverticulosis of large intestine without perforation or abscess without bleeding: Secondary | ICD-10-CM | POA: Diagnosis not present

## 2023-09-28 DIAGNOSIS — Z860101 Personal history of adenomatous and serrated colon polyps: Secondary | ICD-10-CM | POA: Diagnosis not present

## 2023-11-15 DIAGNOSIS — R8290 Unspecified abnormal findings in urine: Secondary | ICD-10-CM | POA: Diagnosis not present

## 2023-11-15 DIAGNOSIS — M545 Low back pain, unspecified: Secondary | ICD-10-CM | POA: Diagnosis not present

## 2023-11-16 ENCOUNTER — Other Ambulatory Visit: Payer: Self-pay | Admitting: Family Medicine

## 2023-11-16 ENCOUNTER — Other Ambulatory Visit: Payer: Medicare Other

## 2023-11-16 ENCOUNTER — Ambulatory Visit
Admission: RE | Admit: 2023-11-16 | Discharge: 2023-11-16 | Disposition: A | Discharge status: Home / Self Care | Payer: Medicare Other | Source: Ambulatory Visit | Attending: Family Medicine | Admitting: Family Medicine

## 2023-11-16 DIAGNOSIS — M545 Low back pain, unspecified: Secondary | ICD-10-CM

## 2023-11-29 DIAGNOSIS — M545 Low back pain, unspecified: Secondary | ICD-10-CM | POA: Diagnosis not present

## 2023-12-02 DIAGNOSIS — M545 Low back pain, unspecified: Secondary | ICD-10-CM | POA: Diagnosis not present

## 2023-12-06 DIAGNOSIS — M545 Low back pain, unspecified: Secondary | ICD-10-CM | POA: Diagnosis not present

## 2024-06-08 ENCOUNTER — Other Ambulatory Visit: Payer: Self-pay | Admitting: Family Medicine

## 2024-06-08 DIAGNOSIS — Z1231 Encounter for screening mammogram for malignant neoplasm of breast: Secondary | ICD-10-CM

## 2024-06-21 ENCOUNTER — Ambulatory Visit
Admission: RE | Admit: 2024-06-21 | Discharge: 2024-06-21 | Disposition: A | Discharge status: Home / Self Care | Source: Ambulatory Visit | Attending: Family Medicine | Admitting: Family Medicine

## 2024-06-21 DIAGNOSIS — Z1231 Encounter for screening mammogram for malignant neoplasm of breast: Secondary | ICD-10-CM

## 2024-08-04 ENCOUNTER — Other Ambulatory Visit (HOSPITAL_BASED_OUTPATIENT_CLINIC_OR_DEPARTMENT_OTHER): Payer: Self-pay | Admitting: Family Medicine

## 2024-08-04 DIAGNOSIS — E2839 Other primary ovarian failure: Secondary | ICD-10-CM

## 2024-10-19 ENCOUNTER — Ambulatory Visit (HOSPITAL_BASED_OUTPATIENT_CLINIC_OR_DEPARTMENT_OTHER)
Admission: RE | Admit: 2024-10-19 | Discharge: 2024-10-19 | Disposition: A | Discharge status: Home / Self Care | Source: Ambulatory Visit | Attending: Family Medicine | Admitting: Family Medicine

## 2024-10-19 DIAGNOSIS — E2839 Other primary ovarian failure: Secondary | ICD-10-CM | POA: Diagnosis present
